# Patient Record
Sex: Female | Born: 1955 | Race: White | Hispanic: No | Marital: Married | Smoking: Former smoker
Health system: Southern US, Community
[De-identification: ages and names within clinical notes are randomized; demographics above are authoritative.]

## PROBLEM LIST (undated history)

## (undated) DIAGNOSIS — G709 Myoneural disorder, unspecified: Secondary | ICD-10-CM

## (undated) DIAGNOSIS — R110 Nausea: Secondary | ICD-10-CM

## (undated) DIAGNOSIS — K8020 Calculus of gallbladder without cholecystitis without obstruction: Secondary | ICD-10-CM

## (undated) DIAGNOSIS — R109 Unspecified abdominal pain: Secondary | ICD-10-CM

## (undated) DIAGNOSIS — I10 Essential (primary) hypertension: Secondary | ICD-10-CM

## (undated) HISTORY — DX: Essential (primary) hypertension: I10

## (undated) HISTORY — DX: Unspecified abdominal pain: R10.9

## (undated) HISTORY — DX: Nausea: R11.0

## (undated) HISTORY — DX: Calculus of gallbladder without cholecystitis without obstruction: K80.20

---

## 1983-07-02 HISTORY — PX: TUBAL LIGATION: SHX77

## 1991-07-02 HISTORY — PX: KNEE SURGERY: SHX244

## 1998-03-04 ENCOUNTER — Encounter: Payer: Self-pay | Admitting: Otolaryngology

## 1998-03-04 ENCOUNTER — Ambulatory Visit (HOSPITAL_COMMUNITY): Admission: RE | Admit: 1998-03-04 | Discharge: 1998-03-04 | Payer: Self-pay | Admitting: Otolaryngology

## 1998-07-01 ENCOUNTER — Emergency Department (HOSPITAL_COMMUNITY): Admission: EM | Admit: 1998-07-01 | Discharge: 1998-07-01 | Payer: Self-pay | Admitting: Emergency Medicine

## 1998-07-01 ENCOUNTER — Encounter: Payer: Self-pay | Admitting: Emergency Medicine

## 1999-07-10 ENCOUNTER — Encounter (INDEPENDENT_AMBULATORY_CARE_PROVIDER_SITE_OTHER): Payer: Self-pay

## 1999-07-10 ENCOUNTER — Ambulatory Visit (HOSPITAL_COMMUNITY): Admission: RE | Admit: 1999-07-10 | Discharge: 1999-07-10 | Payer: Self-pay | Admitting: Gastroenterology

## 2002-06-02 ENCOUNTER — Other Ambulatory Visit: Admission: RE | Admit: 2002-06-02 | Discharge: 2002-06-02 | Payer: Self-pay | Admitting: Obstetrics and Gynecology

## 2003-06-09 ENCOUNTER — Encounter: Admission: RE | Admit: 2003-06-09 | Discharge: 2003-06-09 | Payer: Self-pay | Admitting: Family Medicine

## 2003-12-07 ENCOUNTER — Other Ambulatory Visit: Admission: RE | Admit: 2003-12-07 | Discharge: 2003-12-07 | Payer: Self-pay | Admitting: Obstetrics and Gynecology

## 2004-01-09 ENCOUNTER — Emergency Department (HOSPITAL_COMMUNITY): Admission: EM | Admit: 2004-01-09 | Discharge: 2004-01-09 | Payer: Self-pay | Admitting: Emergency Medicine

## 2006-10-30 ENCOUNTER — Other Ambulatory Visit: Admission: RE | Admit: 2006-10-30 | Discharge: 2006-10-30 | Payer: Self-pay | Admitting: Obstetrics & Gynecology

## 2007-11-03 ENCOUNTER — Other Ambulatory Visit: Admission: RE | Admit: 2007-11-03 | Discharge: 2007-11-03 | Payer: Self-pay | Admitting: Obstetrics & Gynecology

## 2008-11-03 ENCOUNTER — Other Ambulatory Visit: Admission: RE | Admit: 2008-11-03 | Discharge: 2008-11-03 | Payer: Self-pay | Admitting: Obstetrics and Gynecology

## 2009-07-17 ENCOUNTER — Encounter: Admission: RE | Admit: 2009-07-17 | Discharge: 2009-07-17 | Payer: Self-pay | Admitting: Psychiatry

## 2011-11-13 ENCOUNTER — Encounter (INDEPENDENT_AMBULATORY_CARE_PROVIDER_SITE_OTHER): Payer: Self-pay

## 2011-12-16 ENCOUNTER — Telehealth (INDEPENDENT_AMBULATORY_CARE_PROVIDER_SITE_OTHER): Payer: Self-pay | Admitting: General Surgery

## 2011-12-16 ENCOUNTER — Encounter (INDEPENDENT_AMBULATORY_CARE_PROVIDER_SITE_OTHER): Payer: Self-pay | Admitting: General Surgery

## 2011-12-16 ENCOUNTER — Ambulatory Visit (INDEPENDENT_AMBULATORY_CARE_PROVIDER_SITE_OTHER): Payer: BC Managed Care – PPO | Admitting: General Surgery

## 2011-12-16 VITALS — BP 128/80 | HR 70 | Temp 97.6°F | Resp 14 | Ht 66.0 in | Wt 204.5 lb

## 2011-12-16 DIAGNOSIS — K8020 Calculus of gallbladder without cholecystitis without obstruction: Secondary | ICD-10-CM | POA: Insufficient documentation

## 2011-12-16 NOTE — Progress Notes (Signed)
Patient ID: Kristie Nguyen, female   DOB: 04/23/1956, 56 y.o.   MRN: [id number]  Chief Complaint  Patient presents with  . Cholelithiasis    HPI Kristie Nguyen is a 56 y.o. female.   HPI  She is referred by Penny Jones, family nurse practitioner, for evaluation of symptomatic cholelithiasis. She has had intermittent right upper quadrant pain radiating to her back with nausea for many years. Recently, the episodes have become more frequent. She underwent an ultrasound demonstrating cholelithiasis. I do not currently have the ultrasound report but this has been requested. Liver function tests and CBC were within normal limits. No vomiting or fever. No jaundice.  Past Medical History  Diagnosis Date  . Hypertension   . Gallstones   . Nausea   . Abdominal pain     Multiple sclerosis   Depressed mood   Insomnia  Past Surgical History  Procedure Date  . Knee surgery 1993    left  . Tubal ligation 1985    Family History  Problem Relation Age of Onset  . COPD Mother   . Dementia Father     Social History History  Substance Use Topics  . Smoking status: Former Smoker    Quit date: 12/15/2005  . Smokeless tobacco: Never Used  . Alcohol Use: Yes     one beer per month    Allergies  Allergen Reactions  . Sulfa Antibiotics Nausea Only    Current Outpatient Prescriptions  Medication Sig Dispense Refill  . aspirin 81 MG tablet Take 81 mg by mouth daily.      . Calcium Carbonate-Vitamin D (CALCIUM 600 + D PO) Take by mouth 2 (two) times daily.      . cetirizine (ZYRTEC) 10 MG tablet Take 10 mg by mouth daily.      . CRANBERRY-VITAMIN C PO Take 4,200 mg by mouth as needed.      . donepezil (ARICEPT) 10 MG tablet 10 mg as needed.       . estradiol (ESTRACE) 0.5 MG tablet Take 0.5 mg by mouth daily.      . Multiple Vitamin (MULTIVITAMIN) tablet Take 1 tablet by mouth daily.      . natalizumab (TYSABRI) 300 MG/15ML injection Inject into the vein every 30 (thirty) days.       . Nutritional Supplements (MELATONIN PO) Take 10 mg by mouth daily.      . omeprazole (PRILOSEC) 20 MG capsule Take 20 mg by mouth daily.      . oxybutynin (DITROPAN) 5 MG tablet Take 5 mg by mouth as needed.      . Probiotic Product (PROBIOTIC FORMULA PO) Take by mouth as needed.      . CHANTIX STARTING MONTH PAK 0.5 MG X 11 & 1 MG X 42 tablet       . lisinopril-hydrochlorothiazide (PRINZIDE,ZESTORETIC) 10-12.5 MG per tablet 1 tablet daily.       . methylphenidate (RITALIN) 10 MG tablet 10 mg 2 (two) times daily.       . MIMVEY 1-0.5 MG per tablet 1 tablet daily.       . sertraline (ZOLOFT) 100 MG tablet 100 mg daily.       . tiZANidine (ZANAFLEX) 4 MG tablet 4 mg as needed.       . zolpidem (AMBIEN) 10 MG tablet 10 mg at bedtime as needed.         Review of Systems Review of Systems  Constitutional: Negative for fever and chills.  Respiratory:   Negative.   Cardiovascular: Negative.   Gastrointestinal: Positive for nausea and abdominal pain.  Genitourinary: Negative.   Skin: Negative for color change.    Blood pressure 128/80, pulse 70, temperature 97.6 F (36.4 C), temperature source Temporal, resp. rate 14, height 5' 6" (1.676 m), weight 204 lb 8 oz (92.761 kg).  Physical Exam Physical Exam  Constitutional:       Overweight female in no acute distress.  HENT:  Head: Normocephalic and atraumatic.  Eyes: EOM are normal. No scleral icterus.  Cardiovascular: Normal rate and regular rhythm.   Pulmonary/Chest: Effort normal and breath sounds normal.  Abdominal: She exhibits no distension and no mass. There is tenderness (mild in RUQ). There is no guarding.       Small subumbilical scar  Musculoskeletal: She exhibits no edema.  Skin: Skin is warm and dry.    Data Reviewed Notes from primary care physician's office.  Assessment    Symptomatic cholelithiasis. Multiple sclerosis is well-controlled.    Plan    We discussed cholecystectomy, laparoscopic possible open.  She is interested in having this done.  I have explained the procedure, risks, and aftercare of cholecystectomy.  Risks include but are not limited to bleeding, infection, wound problems, anesthesia, diarrhea, bile leak, injury to common bile duct/liver/intestine.  She seems to understand and agrees to proceed.        Nell Gales J 12/16/2011, 10:04 AM    

## 2011-12-16 NOTE — Patient Instructions (Signed)
Strict lowfat diet.  Stop your Aspirin at least 5 days before your surgery.

## 2011-12-17 ENCOUNTER — Encounter (INDEPENDENT_AMBULATORY_CARE_PROVIDER_SITE_OTHER): Payer: Self-pay

## 2011-12-18 ENCOUNTER — Encounter (INDEPENDENT_AMBULATORY_CARE_PROVIDER_SITE_OTHER): Payer: Self-pay

## 2011-12-18 ENCOUNTER — Telehealth (INDEPENDENT_AMBULATORY_CARE_PROVIDER_SITE_OTHER): Payer: Self-pay

## 2011-12-18 NOTE — Telephone Encounter (Signed)
Faxed RTW note to [phone] per pt's request. The pt requested a RTW note to go back to work on 01/06/12 after her lap chole on 12/31/11.

## 2011-12-19 ENCOUNTER — Encounter (HOSPITAL_COMMUNITY): Payer: Self-pay | Admitting: Pharmacy Technician

## 2011-12-20 ENCOUNTER — Encounter (HOSPITAL_COMMUNITY)
Admission: RE | Admit: 2011-12-20 | Discharge: 2011-12-20 | Disposition: A | Discharge status: Home / Self Care | Payer: BC Managed Care – PPO | Source: Ambulatory Visit | Attending: General Surgery | Admitting: General Surgery

## 2011-12-20 ENCOUNTER — Ambulatory Visit (HOSPITAL_COMMUNITY)
Admission: RE | Admit: 2011-12-20 | Discharge: 2011-12-20 | Disposition: A | Discharge status: Home / Self Care | Payer: BC Managed Care – PPO | Source: Ambulatory Visit | Attending: General Surgery | Admitting: General Surgery

## 2011-12-20 ENCOUNTER — Encounter (HOSPITAL_COMMUNITY): Payer: Self-pay

## 2011-12-20 DIAGNOSIS — Z01811 Encounter for preprocedural respiratory examination: Secondary | ICD-10-CM | POA: Insufficient documentation

## 2011-12-20 DIAGNOSIS — Z01810 Encounter for preprocedural cardiovascular examination: Secondary | ICD-10-CM | POA: Insufficient documentation

## 2011-12-20 HISTORY — DX: Myoneural disorder, unspecified: G70.9

## 2011-12-20 LAB — COMPREHENSIVE METABOLIC PANEL
Alkaline Phosphatase: 72 U/L (ref 39–117)
BUN: 17 mg/dL (ref 6–23)
Creatinine, Ser: 0.77 mg/dL (ref 0.50–1.10)
GFR calc Af Amer: >90 mL/min (ref >90)
Glucose, Bld: 82 mg/dL (ref 70–99)
Potassium: 4.3 mEq/L (ref 3.5–5.1)
Total Protein: 7 g/dL (ref 6.0–8.3)

## 2011-12-20 LAB — SURGICAL PCR SCREEN: Staphylococcus aureus: NEGATIVE

## 2011-12-20 LAB — CBC
HCT: 38.2 % (ref 36.0–46.0)
Hemoglobin: 13 g/dL (ref 12.0–15.0)
MCH: 30.4 pg (ref 26.0–34.0)
MCHC: 34 g/dL (ref 30.0–36.0)
MCV: 89.3 fL (ref 78.0–100.0)

## 2011-12-20 LAB — PROTIME-INR: Prothrombin Time: 13.1 seconds (ref 11.6–15.2)

## 2011-12-20 NOTE — Patient Instructions (Signed)
20 Kristie Nguyen  12/20/2011   Your procedure is scheduled on:  12/31/11 [id number]  Report to [facility] at 0700 AM.  Call this number if you have problems the morning of surgery: [phone]   Remember:   Do not eat food:After Midnight.  May have clear liquids:until Midnight .   Take these medicines the morning of surgery with A SIP OF WATER:   Do not wear jewelry, make-up or nail polish.  Do not wear lotions, powders, or perfumes.   Do not shave 48 hours prior to surgery.   Do not bring valuables to the hospital.  Contacts, dentures or bridgework may not be worn into surgery.     Patients discharged the day of surgery will not be allowed to drive home.  Name and phone number of your driver:   Special Instructions: CHG Shower Use Special Wash: 1/2 bottle night before surgery and 1/2 bottle morning of surgery. shower chin to toes with CHG.  Wash face and private parts with regular soap.    Please read over the following fact sheets that you were given: MRSA Information, coughing and deep breathing exericses, leg exercises

## 2011-12-20 NOTE — Pre-Procedure Instructions (Signed)
Teach Back Method used for teaching on preop appointment.   

## 2011-12-25 ENCOUNTER — Inpatient Hospital Stay (HOSPITAL_COMMUNITY): Admission: RE | Admit: 2011-12-25 | Payer: BC Managed Care – PPO | Source: Ambulatory Visit

## 2011-12-31 ENCOUNTER — Encounter (HOSPITAL_COMMUNITY): Payer: Self-pay

## 2011-12-31 ENCOUNTER — Encounter (HOSPITAL_COMMUNITY)
Admission: RE | Disposition: A | Discharge status: Home / Self Care | Payer: Self-pay | Source: Ambulatory Visit | Attending: General Surgery

## 2011-12-31 ENCOUNTER — Ambulatory Visit (HOSPITAL_COMMUNITY): Payer: BC Managed Care – PPO

## 2011-12-31 ENCOUNTER — Ambulatory Visit (HOSPITAL_COMMUNITY): Payer: BC Managed Care – PPO | Admitting: Anesthesiology

## 2011-12-31 ENCOUNTER — Encounter (HOSPITAL_COMMUNITY): Payer: Self-pay | Admitting: Anesthesiology

## 2011-12-31 ENCOUNTER — Ambulatory Visit (HOSPITAL_COMMUNITY)
Admission: RE | Admit: 2011-12-31 | Discharge: 2011-12-31 | Disposition: A | Discharge status: Home / Self Care | Payer: BC Managed Care – PPO | Source: Ambulatory Visit | Attending: General Surgery | Admitting: General Surgery

## 2011-12-31 DIAGNOSIS — I10 Essential (primary) hypertension: Secondary | ICD-10-CM | POA: Insufficient documentation

## 2011-12-31 DIAGNOSIS — Z79899 Other long term (current) drug therapy: Secondary | ICD-10-CM | POA: Insufficient documentation

## 2011-12-31 DIAGNOSIS — K8010 Calculus of gallbladder with chronic cholecystitis without obstruction: Secondary | ICD-10-CM

## 2011-12-31 DIAGNOSIS — Z7982 Long term (current) use of aspirin: Secondary | ICD-10-CM | POA: Insufficient documentation

## 2011-12-31 DIAGNOSIS — K824 Cholesterolosis of gallbladder: Secondary | ICD-10-CM

## 2011-12-31 DIAGNOSIS — G35 Multiple sclerosis: Secondary | ICD-10-CM | POA: Insufficient documentation

## 2011-12-31 HISTORY — PX: CHOLECYSTECTOMY: SHX55

## 2011-12-31 SURGERY — LAPAROSCOPIC CHOLECYSTECTOMY WITH INTRAOPERATIVE CHOLANGIOGRAM
Anesthesia: General | Site: Abdomen | Wound class: Clean Contaminated

## 2011-12-31 MED ORDER — MIDAZOLAM HCL 5 MG/5ML IJ SOLN
INTRAMUSCULAR | Status: DC | PRN
  Administered 2011-12-31: 2 mg via INTRAVENOUS

## 2011-12-31 MED ORDER — BUPIVACAINE HCL 0.5 % IJ SOLN
INTRAMUSCULAR | Status: DC | PRN
  Administered 2011-12-31: 16 mL

## 2011-12-31 MED ORDER — HYDROMORPHONE HCL PF 1 MG/ML IJ SOLN
0.2500 mg | INTRAMUSCULAR | Status: DC | PRN
  Administered 2011-12-31: 0.5 mg via INTRAVENOUS
  Administered 2011-12-31 (×2): 0.25 mg via INTRAVENOUS
  Administered 2011-12-31: 0.5 mg via INTRAVENOUS

## 2011-12-31 MED ORDER — IOHEXOL 300 MG/ML  SOLN
INTRAMUSCULAR | Status: DC | PRN
  Administered 2011-12-31: 2.5 mL

## 2011-12-31 MED ORDER — HYDROMORPHONE HCL PF 1 MG/ML IJ SOLN
INTRAMUSCULAR | Status: AC
  Filled 2011-12-31: qty 1

## 2011-12-31 MED ORDER — ACETAMINOPHEN 10 MG/ML IV SOLN
INTRAVENOUS | Status: AC
  Filled 2011-12-31: qty 100

## 2011-12-31 MED ORDER — EPHEDRINE SULFATE 50 MG/ML IJ SOLN
INTRAMUSCULAR | Status: DC | PRN
  Administered 2011-12-31: 5 mg via INTRAVENOUS

## 2011-12-31 MED ORDER — 0.9 % SODIUM CHLORIDE (POUR BTL) OPTIME
TOPICAL | Status: DC | PRN
  Administered 2011-12-31: 1000 mL

## 2011-12-31 MED ORDER — ONDANSETRON HCL 4 MG/2ML IJ SOLN
INTRAMUSCULAR | Status: DC | PRN
  Administered 2011-12-31: 4 mg via INTRAVENOUS

## 2011-12-31 MED ORDER — ACETAMINOPHEN 10 MG/ML IV SOLN
1000.0000 mg | Freq: Once | INTRAVENOUS | Status: AC
  Administered 2011-12-31: 1000 mg via INTRAVENOUS

## 2011-12-31 MED ORDER — ROCURONIUM BROMIDE 100 MG/10ML IV SOLN
INTRAVENOUS | Status: DC | PRN
  Administered 2011-12-31: 45 mg via INTRAVENOUS

## 2011-12-31 MED ORDER — DEXAMETHASONE SODIUM PHOSPHATE 10 MG/ML IJ SOLN
INTRAMUSCULAR | Status: DC | PRN
  Administered 2011-12-31: 10 mg via INTRAVENOUS

## 2011-12-31 MED ORDER — METOCLOPRAMIDE HCL 5 MG/ML IJ SOLN
INTRAMUSCULAR | Status: DC | PRN
  Administered 2011-12-31: 10 mg via INTRAVENOUS

## 2011-12-31 MED ORDER — PROMETHAZINE HCL 25 MG/ML IJ SOLN: 6.2500 mg | INTRAMUSCULAR | Status: DC | PRN

## 2011-12-31 MED ORDER — FENTANYL CITRATE 0.05 MG/ML IJ SOLN
INTRAMUSCULAR | Status: AC
  Filled 2011-12-31: qty 2

## 2011-12-31 MED ORDER — BUPIVACAINE HCL 0.5 % IJ SOLN
INTRAMUSCULAR | Status: AC
  Filled 2011-12-31: qty 1

## 2011-12-31 MED ORDER — PROPOFOL 10 MG/ML IV EMUL
INTRAVENOUS | Status: DC | PRN
  Administered 2011-12-31: 200 mg via INTRAVENOUS

## 2011-12-31 MED ORDER — GLYCOPYRROLATE 0.2 MG/ML IJ SOLN
INTRAMUSCULAR | Status: DC | PRN
  Administered 2011-12-31: .8 mg via INTRAVENOUS

## 2011-12-31 MED ORDER — FENTANYL CITRATE 0.05 MG/ML IJ SOLN
INTRAMUSCULAR | Status: DC | PRN
  Administered 2011-12-31 (×5): 50 ug via INTRAVENOUS

## 2011-12-31 MED ORDER — OXYCODONE-ACETAMINOPHEN 5-325 MG PO TABS: 1.0000 | ORAL_TABLET | ORAL | Status: AC | PRN

## 2011-12-31 MED ORDER — FENTANYL CITRATE 0.05 MG/ML IJ SOLN
50.0000 ug | INTRAMUSCULAR | Status: DC | PRN
  Administered 2011-12-31: 100 ug via INTRAVENOUS

## 2011-12-31 MED ORDER — IOHEXOL 300 MG/ML  SOLN
INTRAMUSCULAR | Status: AC
  Filled 2011-12-31: qty 1

## 2011-12-31 MED ORDER — LACTATED RINGERS IV SOLN
INTRAVENOUS | Status: DC
  Administered 2011-12-31: 1000 mL via INTRAVENOUS

## 2011-12-31 MED ORDER — NEOSTIGMINE METHYLSULFATE 1 MG/ML IJ SOLN
INTRAMUSCULAR | Status: DC | PRN
  Administered 2011-12-31: 5 mg via INTRAVENOUS

## 2011-12-31 MED ORDER — OXYCODONE HCL 5 MG PO TABS
ORAL_TABLET | ORAL | Status: AC
  Administered 2011-12-31: 5 mg
  Filled 2011-12-31: qty 1

## 2011-12-31 MED ORDER — LIDOCAINE HCL (CARDIAC) 20 MG/ML IV SOLN
INTRAVENOUS | Status: DC | PRN
  Administered 2011-12-31: 100 mg via INTRAVENOUS

## 2011-12-31 MED ORDER — LACTATED RINGERS IR SOLN
Status: DC | PRN
  Administered 2011-12-31: 1000 mL

## 2011-12-31 MED ORDER — CEFAZOLIN SODIUM-DEXTROSE 2-3 GM-% IV SOLR
INTRAVENOUS | Status: AC
  Filled 2011-12-31: qty 50

## 2011-12-31 MED ORDER — CEFAZOLIN SODIUM-DEXTROSE 2-3 GM-% IV SOLR
2.0000 g | INTRAVENOUS | Status: AC
  Administered 2011-12-31: 2 g via INTRAVENOUS

## 2011-12-31 MED ORDER — KETOROLAC TROMETHAMINE 30 MG/ML IJ SOLN: 15.0000 mg | Freq: Once | INTRAMUSCULAR | Status: DC | PRN

## 2011-12-31 SURGICAL SUPPLY — 48 items
APL SKNCLS STERI-STRIP NONHPOA (GAUZE/BANDAGES/DRESSINGS) ×1
APPLIER CLIP 5 13 M/L LIGAMAX5 (MISCELLANEOUS) ×2
APPLIER CLIP ROT 10 11.4 M/L (STAPLE)
APR CLP MED LRG 11.4X10 (STAPLE)
APR CLP MED LRG 5 ANG JAW (MISCELLANEOUS) ×1
BAG SPEC RTRVL LRG 6X4 10 (ENDOMECHANICALS) ×1
BENZOIN TINCTURE PRP APPL 2/3 (GAUZE/BANDAGES/DRESSINGS) ×2 IMPLANT
CANISTER SUCTION 2500CC (MISCELLANEOUS) ×2 IMPLANT
CHLORAPREP W/TINT 26ML (MISCELLANEOUS) ×2 IMPLANT
CLIP APPLIE 5 13 M/L LIGAMAX5 (MISCELLANEOUS) ×1 IMPLANT
CLIP APPLIE ROT 10 11.4 M/L (STAPLE) IMPLANT
CLOTH BEACON ORANGE TIMEOUT ST (SAFETY) ×2 IMPLANT
COVER MAYO STAND STRL (DRAPES) ×1 IMPLANT
COVER SURGICAL LIGHT HANDLE (MISCELLANEOUS) ×1 IMPLANT
DECANTER SPIKE VIAL GLASS SM (MISCELLANEOUS) ×2 IMPLANT
DRAPE C-ARM 42X72 X-RAY (DRAPES) ×1 IMPLANT
DRAPE LAPAROSCOPIC ABDOMINAL (DRAPES) ×2 IMPLANT
DRAPE UTILITY XL STRL (DRAPES) ×2 IMPLANT
DRSG TEGADERM 2-3/8X2-3/4 SM (GAUZE/BANDAGES/DRESSINGS) ×8 IMPLANT
ELECT REM PT RETURN 9FT ADLT (ELECTROSURGICAL) ×2
ELECTRODE REM PT RTRN 9FT ADLT (ELECTROSURGICAL) ×1 IMPLANT
ENDOLOOP SUT PDS II  0 18 (SUTURE)
ENDOLOOP SUT PDS II 0 18 (SUTURE) IMPLANT
GAUZE SPONGE 2X2 8PLY STRL LF (GAUZE/BANDAGES/DRESSINGS) IMPLANT
GLOVE BIOGEL PI IND STRL 7.0 (GLOVE) ×1 IMPLANT
GLOVE BIOGEL PI INDICATOR 7.0 (GLOVE) ×1
GLOVE ECLIPSE 8.0 STRL XLNG CF (GLOVE) ×2 IMPLANT
GLOVE INDICATOR 8.0 STRL GRN (GLOVE) ×4 IMPLANT
GOWN STRL NON-REIN LRG LVL3 (GOWN DISPOSABLE) ×3 IMPLANT
GOWN STRL REIN XL XLG (GOWN DISPOSABLE) ×4 IMPLANT
HEMOSTAT SNOW SURGICEL 2X4 (HEMOSTASIS) ×1 IMPLANT
HEMOSTAT SURGICEL 4X8 (HEMOSTASIS) IMPLANT
IV CATH 14GX2 1/4 (CATHETERS) IMPLANT
KIT BASIN OR (CUSTOM PROCEDURE TRAY) ×2 IMPLANT
NS IRRIG 1000ML POUR BTL (IV SOLUTION) IMPLANT
POUCH SPECIMEN RETRIEVAL 10MM (ENDOMECHANICALS) ×1 IMPLANT
SET CHOLANGIOGRAPH MIX (MISCELLANEOUS) ×2 IMPLANT
SET IRRIG TUBING LAPAROSCOPIC (IRRIGATION / IRRIGATOR) ×2 IMPLANT
SOLUTION ANTI FOG 6CC (MISCELLANEOUS) ×2 IMPLANT
SPONGE GAUZE 2X2 STER 10/PKG (GAUZE/BANDAGES/DRESSINGS) ×1
STRIP CLOSURE SKIN 1/2X4 (GAUZE/BANDAGES/DRESSINGS) ×2 IMPLANT
SUT MNCRL AB 4-0 PS2 18 (SUTURE) ×2 IMPLANT
TOWEL OR 17X26 10 PK STRL BLUE (TOWEL DISPOSABLE) ×2 IMPLANT
TRAY LAP CHOLE (CUSTOM PROCEDURE TRAY) ×2 IMPLANT
TROCAR BLADELESS OPT 5 75 (ENDOMECHANICALS) ×6 IMPLANT
TROCAR XCEL BLUNT TIP 100MML (ENDOMECHANICALS) ×2 IMPLANT
TROCAR XCEL NON-BLD 11X100MML (ENDOMECHANICALS) IMPLANT
TUBING INSUFFLATION 10FT LAP (TUBING) ×2 IMPLANT

## 2011-12-31 NOTE — H&P (View-Only) (Signed)
Patient ID: Kristie Nguyen, female   DOB: January 22, 1956, 56 y.o.   MRN: [id number]  Chief Complaint  Patient presents with  . Cholelithiasis    HPI Kristie Nguyen is a 56 y.o. female.   HPI  She is referred by Zoe Lan, family nurse practitioner, for evaluation of symptomatic cholelithiasis. She has had intermittent right upper quadrant pain radiating to her back with nausea for many years. Recently, the episodes have become more frequent. She underwent an ultrasound demonstrating cholelithiasis. I do not currently have the ultrasound report but this has been requested. Liver function tests and CBC were within normal limits. No vomiting or fever. No jaundice.  Past Medical History  Diagnosis Date  . Hypertension   . Gallstones   . Nausea   . Abdominal pain     Multiple sclerosis   Depressed mood   Insomnia  Past Surgical History  Procedure Date  . Knee surgery 1993    left  . Tubal ligation 1985    Family History  Problem Relation Age of Onset  . COPD Mother   . Dementia Father     Social History History  Substance Use Topics  . Smoking status: Former Smoker    Quit date: 12/15/2005  . Smokeless tobacco: Never Used  . Alcohol Use: Yes     one beer per month    Allergies  Allergen Reactions  . Sulfa Antibiotics Nausea Only    Current Outpatient Prescriptions  Medication Sig Dispense Refill  . aspirin 81 MG tablet Take 81 mg by mouth daily.      . Calcium Carbonate-Vitamin D (CALCIUM 600 + D PO) Take by mouth 2 (two) times daily.      . cetirizine (ZYRTEC) 10 MG tablet Take 10 mg by mouth daily.      Marland Kitchen CRANBERRY-VITAMIN C PO Take 4,200 mg by mouth as needed.      . donepezil (ARICEPT) 10 MG tablet 10 mg as needed.       Marland Kitchen estradiol (ESTRACE) 0.5 MG tablet Take 0.5 mg by mouth daily.      . Multiple Vitamin (MULTIVITAMIN) tablet Take 1 tablet by mouth daily.      . natalizumab (TYSABRI) 300 MG/15ML injection Inject into the vein every 30 (thirty) days.       . Nutritional Supplements (MELATONIN PO) Take 10 mg by mouth daily.      Marland Kitchen omeprazole (PRILOSEC) 20 MG capsule Take 20 mg by mouth daily.      Marland Kitchen oxybutynin (DITROPAN) 5 MG tablet Take 5 mg by mouth as needed.      . Probiotic Product (PROBIOTIC FORMULA PO) Take by mouth as needed.      . CHANTIX STARTING MONTH PAK 0.5 MG X 11 & 1 MG X 42 tablet       . lisinopril-hydrochlorothiazide (PRINZIDE,ZESTORETIC) 10-12.5 MG per tablet 1 tablet daily.       . methylphenidate (RITALIN) 10 MG tablet 10 mg 2 (two) times daily.       Marland Kitchen MIMVEY 1-0.5 MG per tablet 1 tablet daily.       . sertraline (ZOLOFT) 100 MG tablet 100 mg daily.       Marland Kitchen tiZANidine (ZANAFLEX) 4 MG tablet 4 mg as needed.       . zolpidem (AMBIEN) 10 MG tablet 10 mg at bedtime as needed.         Review of Systems Review of Systems  Constitutional: Negative for fever and chills.  Respiratory:  Negative.   Cardiovascular: Negative.   Gastrointestinal: Positive for nausea and abdominal pain.  Genitourinary: Negative.   Skin: Negative for color change.    Blood pressure 128/80, pulse 70, temperature 97.6 F (36.4 C), temperature source Temporal, resp. rate 14, height 5\' 6"  (1.676 m), weight 204 lb 8 oz (92.761 kg).  Physical Exam Physical Exam  Constitutional:       Overweight female in no acute distress.  HENT:  Head: Normocephalic and atraumatic.  Eyes: EOM are normal. No scleral icterus.  Cardiovascular: Normal rate and regular rhythm.   Pulmonary/Chest: Effort normal and breath sounds normal.  Abdominal: She exhibits no distension and no mass. There is tenderness (mild in RUQ). There is no guarding.       Small subumbilical scar  Musculoskeletal: She exhibits no edema.  Skin: Skin is warm and dry.    Data Reviewed Notes from primary care physician's office.  Assessment    Symptomatic cholelithiasis. Multiple sclerosis is well-controlled.    Plan    We discussed cholecystectomy, laparoscopic possible open.  She is interested in having this done.  I have explained the procedure, risks, and aftercare of cholecystectomy.  Risks include but are not limited to bleeding, infection, wound problems, anesthesia, diarrhea, bile leak, injury to common bile duct/liver/intestine.  She seems to understand and agrees to proceed.        Kasidee Voisin J 12/16/2011, 10:04 AM

## 2011-12-31 NOTE — Progress Notes (Signed)
1215 up to BR ambulated without problems, unable to void

## 2011-12-31 NOTE — Progress Notes (Signed)
1135 4 incisions # 3 was skipped when the assessment was started

## 2011-12-31 NOTE — Op Note (Signed)
Preoperative diagnosis:  Symptomatic cholelithiasis  Postoperative diagnosis:  Same  Procedure: Laparoscopic cholecystectomy with cholangiogram.  Surgeon: Avel Peace, M.D.  Asst.:  Darnell Level, M.D.  Anesthesia: General with Marcaine (local)  Indication:   This is a 56 year old female who has pretty typical biliary colic type symptoms. She was discovered to have cholelithiasis. Liver function tests were within normal limits. She now presents for elective cholecystectomy. The procedure, risks, and after care were discussed with her preoperatively.  Technique: She was brought to the operating room, placed supine on the operating table, and a general anesthetic was administered. The hair on the abdominal wall was clipped as was necessary. The abdominal wall was then sterilely prepped and draped. Local anesthetic (Marcaine) was infiltrated in the subumbilical region. A small subumbilical incision was made through the skin, subcutaneous tissue, fascia, and peritoneum entering the peritoneal cavity under direct vision. A pursestring suture of 0 Vicryl was placed around the edges of the fascia. A Hassan trocar was introduced into the peritoneal cavity and a pneumoperitoneum was created by insufflation of carbon dioxide gas. The laparoscope was introduced into the trocar and no underlying bleeding or organ injury was noted. The patient was then placed in the reverse Trendelenburg position with the right side tilted slightly up.  Three more trochars were then placed into the abdominal cavity under laparoscopic vision. One in the epigastric area, and 2 in the right upper quadrant area. The gallbladder was visualized and the fundus was grasped and retracted toward the right shoulder.  There were adhesions between the omentum and the body and infundibulum of the gallbladder which were separated bluntly.  The infundibulum was mobilized with dissection close to the gallbladder and retracted laterally. The  cystic duct was identified and a window was created around it. The cystic artery was also identified and a window was created around it. The critical view was achieved. A clip was placed at the neck of the gallbladder. A small incision was made in the cystic duct. A cholangiocatheter was introduced through the anterior abdominal wall and placed in the cystic duct. A intraoperative cholangiogram was then performed.  Under real-time fluoroscopy, dilute contrast was injected into the cystic duct.  The common hepatic duct, the right and left hepatic ducts, and the common duct were all visualized. Contrast drained into the duodenum without obvious evidence of any obstructing ductal lesion. The final report is pending the Radiologist's interpretation.  The cholangiocatheter was removed, the cystic duct was clipped 3 times on the biliary side, and then the cystic duct was divided sharply. No bile leak was noted from the cystic duct stump.  The cystic artery was then clipped and divided. Following this the gallbladder was dissected free from the liver using electrocautery. The gallbladder was then placed in a retrieval bag and removed from the abdominal cavity through the subumbilical incision.  The gallbladder fossa was inspected, irrigated, and bleeding was controlled with electrocautery. Inspection showed that hemostasis was adequate and there was no evidence of bile leak.  A piece of Surgicel was placed in the gallbladder fossa.  The irrigation fluid was evacuated as much as possible.  The subumbilical trocar was removed and the fascial defect was closed by tightening and tying down the pursestring suture under laparoscopic vision.  The remaining trochars were removed and the pneumoperitoneum was released. The skin incisions were closed with 4-0 Monocryl subcuticular stitches. Steri-Strips and sterile dressings were applied.  The procedure was well-tolerated without any apparent complications. The patient  was  taken to the recovery room in satisfactory condition.

## 2011-12-31 NOTE — Preoperative (Signed)
Beta Blockers   Reason not to administer Beta Blockers:Not Applicable 

## 2011-12-31 NOTE — Anesthesia Postprocedure Evaluation (Signed)
  Anesthesia Post-op Note  Patient: Kristie Nguyen  Procedure(s) Performed: Procedure(s) (LRB): LAPAROSCOPIC CHOLECYSTECTOMY WITH INTRAOPERATIVE CHOLANGIOGRAM (N/A)  Patient Location: PACU  Anesthesia Type: General  Level of Consciousness: awake and alert   Airway and Oxygen Therapy: Patient Spontanous Breathing  Post-op Pain: mild  Post-op Assessment: Post-op Vital signs reviewed, Patient's Cardiovascular Status Stable, Respiratory Function Stable, Patent Airway and No signs of Nausea or vomiting  Post-op Vital Signs: stable  Complications: No apparent anesthesia complications

## 2011-12-31 NOTE — Progress Notes (Signed)
1220 Unable to void

## 2011-12-31 NOTE — Discharge Instructions (Signed)
[facility] ______[facility] LAPAROSCOPIC SURGERY: POST OP INSTRUCTIONS Always review your discharge instruction sheet given to you by the facility where your surgery was performed. IF YOU HAVE DISABILITY OR FAMILY LEAVE FORMS, YOU MUST BRING THEM TO THE OFFICE FOR PROCESSING.   DO NOT GIVE THEM TO YOUR DOCTOR.  1. A prescription for pain medication may be given to you upon discharge.  Take your pain medication as prescribed, if needed.  If narcotic pain medicine is not needed, then you may take acetaminophen (Tylenol) or ibuprofen (Advil) as needed. 2. Take your usually prescribed medications unless otherwise directed. 3. If you need a refill on your pain medication, please contact your pharmacy.  They will contact our office to request authorization. Prescriptions will not be filled after 5pm or on week-ends. 4. You should follow a light diet the first few days after arrival home, such as soup and crackers, etc.  Be sure to include lots of fluids daily. 5. Most patients will experience some swelling and bruising in the area of the incisions.  Ice packs will help.  Swelling and bruising can take several days to resolve.  6. It is common to experience some constipation if taking pain medication after surgery.  Increasing fluid intake and taking a stool softener (such as Colace) will usually help or prevent this problem from occurring.  A mild laxative (Milk of Magnesia or Miralax) should be taken according to package instructions if there are no bowel movements after 48 hours. 7. Unless discharge instructions indicate otherwise, you may remove your bandages 72 hours after surgery, and you may shower at that time.  You may have steri-strips (small skin tapes) in place directly over the incision.  These strips should be left on the skin for 14 days.  If your surgeon used skin glue on the incision, you may shower in 24 hours.  The glue will flake off over the next 2-3 weeks.  Any sutures or  staples will be removed at the office during your follow-up visit. 8. ACTIVITIES:  You may resume regular (light) daily activities beginning the next day--such as daily self-care, walking, climbing stairs--gradually increasing activities as tolerated.  You may have sexual intercourse when it is comfortable.  Refrain from any heavy lifting or straining until approved by your doctor-nothing over 10 pounds for 2 weeks. a. You may drive when you are no longer taking prescription pain medication, you can comfortably wear a seatbelt, and you can safely maneuver your car and apply brakes. b. 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 You should see your doctor in the office for a follow-up appointment approximately 2-3 weeks after your surgery.  Make sure that you call for this appointment within a day or two after you arrive home to insure a convenient appointment time. 10. OTHER INSTRUCTIONS: __________________________________________________________________________________________________________________________ __________________________________________________________________________________________________________________________ WHEN TO CALL YOUR DOCTOR: 1. Fever over 101.5 2. Inability to urinate 3. Continued bleeding from incision. 4. Increased pain, redness, or drainage from the incision. 5. Increasing abdominal pain  The clinic staff is available to answer your questions during regular business hours.  Please don't hesitate to call and ask to speak to one of the nurses for clinical concerns.  If you have a medical emergency, go to the nearest emergency room or call 911.  A surgeon from [facility] is always on call at the hospital. [address], [address], [place], [place]  [address] ? [address]  [place] [place], [place]   [address] [phone] ? [phone] ? FAX [phone] Web site:  [email or web address]

## 2011-12-31 NOTE — Anesthesia Preprocedure Evaluation (Addendum)
Anesthesia Evaluation  Patient identified by MRN, date of birth, ID band Patient awake    Reviewed: Allergy & Precautions, H&P , NPO status , Patient's Chart, lab work & pertinent test results  History of Anesthesia Complications (+) AWARENESS UNDER ANESTHESIA  Airway Mallampati: II TM Distance: <3 FB Neck ROM: Full    Dental No notable dental hx.    Pulmonary former smoker breath sounds clear to auscultation  Pulmonary exam normal - decreased breath sounds      Cardiovascular hypertension, Pt. on medications Rhythm:Regular Rate:Normal     Neuro/Psych negative neurological ROS  negative psych ROS   GI/Hepatic Neg liver ROS, GERD-  Medicated,  Endo/Other  Morbid obesity  Renal/GU negative Renal ROS  negative genitourinary   Musculoskeletal negative musculoskeletal ROS (+)   Abdominal   Peds negative pediatric ROS (+)  Hematology negative hematology ROS (+)   Anesthesia Other Findings   Reproductive/Obstetrics negative OB ROS                          Anesthesia Physical Anesthesia Plan  ASA: II  Anesthesia Plan: General   Post-op Pain Management:    Induction: Intravenous  Airway Management Planned: Oral ETT  Additional Equipment:   Intra-op Plan:   Post-operative Plan: Extubation in OR  Informed Consent: I have reviewed the patients History and Physical, chart, labs and discussed the procedure including the risks, benefits and alternatives for the proposed anesthesia with the patient or authorized representative who has indicated his/her understanding and acceptance.   Dental advisory given  Plan Discussed with: CRNA  Anesthesia Plan Comments:         Anesthesia Quick Evaluation

## 2011-12-31 NOTE — Interval H&P Note (Signed)
History and Physical Interval Note:  12/31/2011 9:11 AM  Kristie Nguyen  has presented today for surgery, with the diagnosis of symptomatic cholelithiasis  The various methods of treatment have been discussed with the patient and family. After consideration of risks, benefits and other options for treatment, the patient has consented to  Procedure(s) (LRB): LAPAROSCOPIC CHOLECYSTECTOMY WITH INTRAOPERATIVE CHOLANGIOGRAM (N/A) as a surgical intervention .  The patient's history has been reviewed, patient examined, no change in status, stable for surgery.  I have reviewed the patients' chart and labs.  Questions were answered to the patient's satisfaction.     Tae Robak [facility] [place]

## 2011-12-31 NOTE — Transfer of Care (Signed)
Immediate Anesthesia Transfer of Care Note  Patient: Kristie Nguyen  Procedure(s) Performed: Procedure(s) (LRB): LAPAROSCOPIC CHOLECYSTECTOMY WITH INTRAOPERATIVE CHOLANGIOGRAM (N/A)  Patient Location: PACU  Anesthesia Type: General  Level of Consciousness: awake, alert , oriented and patient cooperative  Airway & Oxygen Therapy: Patient Spontanous Breathing and Patient connected to face mask oxygen  Post-op Assessment: Report given to PACU RN, Post -op Vital signs reviewed and stable and Patient moving all extremities  Post vital signs: Reviewed and stable  Complications: No apparent anesthesia complications

## 2012-01-01 ENCOUNTER — Encounter (HOSPITAL_COMMUNITY): Payer: Self-pay | Admitting: General Surgery

## 2012-01-08 ENCOUNTER — Encounter (INDEPENDENT_AMBULATORY_CARE_PROVIDER_SITE_OTHER): Payer: Self-pay

## 2012-01-20 ENCOUNTER — Encounter (INDEPENDENT_AMBULATORY_CARE_PROVIDER_SITE_OTHER): Payer: Self-pay | Admitting: General Surgery

## 2012-01-20 ENCOUNTER — Ambulatory Visit (INDEPENDENT_AMBULATORY_CARE_PROVIDER_SITE_OTHER): Payer: BC Managed Care – PPO | Admitting: General Surgery

## 2012-01-20 VITALS — BP 120/78 | HR 80 | Temp 97.3°F | Resp 16 | Ht 66.0 in | Wt 205.0 lb

## 2012-01-20 DIAGNOSIS — Z9889 Other specified postprocedural states: Secondary | ICD-10-CM

## 2012-01-20 NOTE — Patient Instructions (Signed)
Low fat diet recommended.

## 2012-01-20 NOTE — Progress Notes (Signed)
She is here for a postop visit following laparoscopic cholecystectomy.  Diet is being tolerated, bowels are moving.  No problems with incisions.  PE:  ABD:  Soft, incisions are a little hyperemic but not infected.  Assessment:  Doing well postop.  Plan:  Lowfat diet recommended.  Activities as tolerated.  Return visit prn.

## 2012-04-22 LAB — HM PAP SMEAR

## 2012-11-18 ENCOUNTER — Other Ambulatory Visit: Payer: Self-pay | Admitting: Specialist

## 2012-11-18 DIAGNOSIS — M8580 Other specified disorders of bone density and structure, unspecified site: Secondary | ICD-10-CM

## 2012-11-26 ENCOUNTER — Other Ambulatory Visit: Payer: BC Managed Care – PPO

## 2012-12-01 ENCOUNTER — Ambulatory Visit
Admission: RE | Admit: 2012-12-01 | Discharge: 2012-12-01 | Disposition: A | Discharge status: Home / Self Care | Payer: BC Managed Care – PPO | Source: Ambulatory Visit | Attending: Specialist | Admitting: Specialist

## 2012-12-01 DIAGNOSIS — M8580 Other specified disorders of bone density and structure, unspecified site: Secondary | ICD-10-CM

## 2012-12-02 ENCOUNTER — Other Ambulatory Visit: Payer: BC Managed Care – PPO

## 2013-02-09 ENCOUNTER — Other Ambulatory Visit: Payer: Self-pay | Admitting: Psychiatry

## 2013-02-09 DIAGNOSIS — G35 Multiple sclerosis: Secondary | ICD-10-CM

## 2013-02-15 ENCOUNTER — Other Ambulatory Visit: Payer: BC Managed Care – PPO

## 2013-02-17 ENCOUNTER — Ambulatory Visit
Admission: RE | Admit: 2013-02-17 | Discharge: 2013-02-17 | Disposition: A | Discharge status: Home / Self Care | Payer: BC Managed Care – PPO | Source: Ambulatory Visit | Attending: Psychiatry | Admitting: Psychiatry

## 2013-02-17 DIAGNOSIS — G35 Multiple sclerosis: Secondary | ICD-10-CM

## 2013-02-17 MED ORDER — GADOBENATE DIMEGLUMINE 529 MG/ML IV SOLN
19.0000 mL | Freq: Once | INTRAVENOUS | Status: AC | PRN
  Administered 2013-02-17: 19 mL via INTRAVENOUS

## 2013-03-22 ENCOUNTER — Ambulatory Visit (INDEPENDENT_AMBULATORY_CARE_PROVIDER_SITE_OTHER): Payer: BC Managed Care – PPO | Admitting: Nurse Practitioner

## 2013-03-22 ENCOUNTER — Encounter: Payer: Self-pay | Admitting: Nurse Practitioner

## 2013-03-22 VITALS — BP 120/76 | HR 64 | Ht 65.25 in | Wt 210.0 lb

## 2013-03-22 DIAGNOSIS — N951 Menopausal and female climacteric states: Secondary | ICD-10-CM

## 2013-03-22 MED ORDER — ESTRADIOL-NORETHINDRONE ACET 1-0.5 MG PO TABS: 1.0000 | ORAL_TABLET | Freq: Every day | ORAL | Status: DC

## 2013-03-22 NOTE — Progress Notes (Signed)
Subjective:     Patient ID: Kristie Nguyen, female   DOB: 1955-11-24, 57 y.o.   MRN: [id number]  HPI  57 yo G2P2 MW Fe presents to review how her HRT is doing.  She has recently changed to [facility] which is a generic for Activella.  She reports no vaso symptoms including night sweats.  Her sleep patterns and mood has improved.  No headaches and no vaginal dryness. Denies vaginal bleeding.  She has had no signs of atypical chest pains or  palpitations. No signs of DVT, and not smoking now.  She did have a flare of MS this past May.  Better now and on a new med with good symptoms relief.  She is now on short term disability.  In general feels well.  February 10, 2013  AEX was done at PCP. Mammogram 02/09/13 BMD 12/01/12 - normal  Review of Systems  Constitutional: Positive for fatigue. Negative for fever, chills, diaphoresis, appetite change and unexpected weight change.  HENT: Negative.   Respiratory: Negative.   Cardiovascular: Negative.   Gastrointestinal: Negative.   Genitourinary: Negative.  Negative for dysuria, urgency, frequency, hematuria, vaginal bleeding, vaginal discharge, menstrual problem, pelvic pain and dyspareunia.  Musculoskeletal: Positive for myalgias, back pain, joint swelling, arthralgias and gait problem.       Secondary to MS flare  Skin: Negative.   Neurological: Negative.   Psychiatric/Behavioral: Negative.        Objective:   Physical Exam  Constitutional: She is oriented to person, place, and time. She appears well-developed and well-nourished.  No exam indicated for today.  Neurological: She is alert and oriented to person, place, and time.  Psychiatric: She has a normal mood and affect. Her behavior is normal. Judgment and thought content normal.       Assessment:     Postmenopausal on HRT Symptom relief with this HRT No PMB    Plan:     Refill Mimvey for another year and plan AEX with pap here next fall.

## 2013-03-23 ENCOUNTER — Encounter: Payer: Self-pay | Admitting: Nurse Practitioner

## 2013-03-23 NOTE — Progress Notes (Signed)
Encounter reviewed by Dr. Brook Silva.  

## 2013-05-06 ENCOUNTER — Other Ambulatory Visit: Payer: Self-pay

## 2013-05-31 ENCOUNTER — Encounter: Payer: Self-pay | Admitting: Obstetrics and Gynecology

## 2013-11-02 IMAGING — CR DG CHEST 2V
2 series · 2 of 2 positions shown · non-contrast
Comparison: None.

CLINICAL DATA: Preoperative respiratory films.

CHEST - 2 VIEW

[w chest pa]
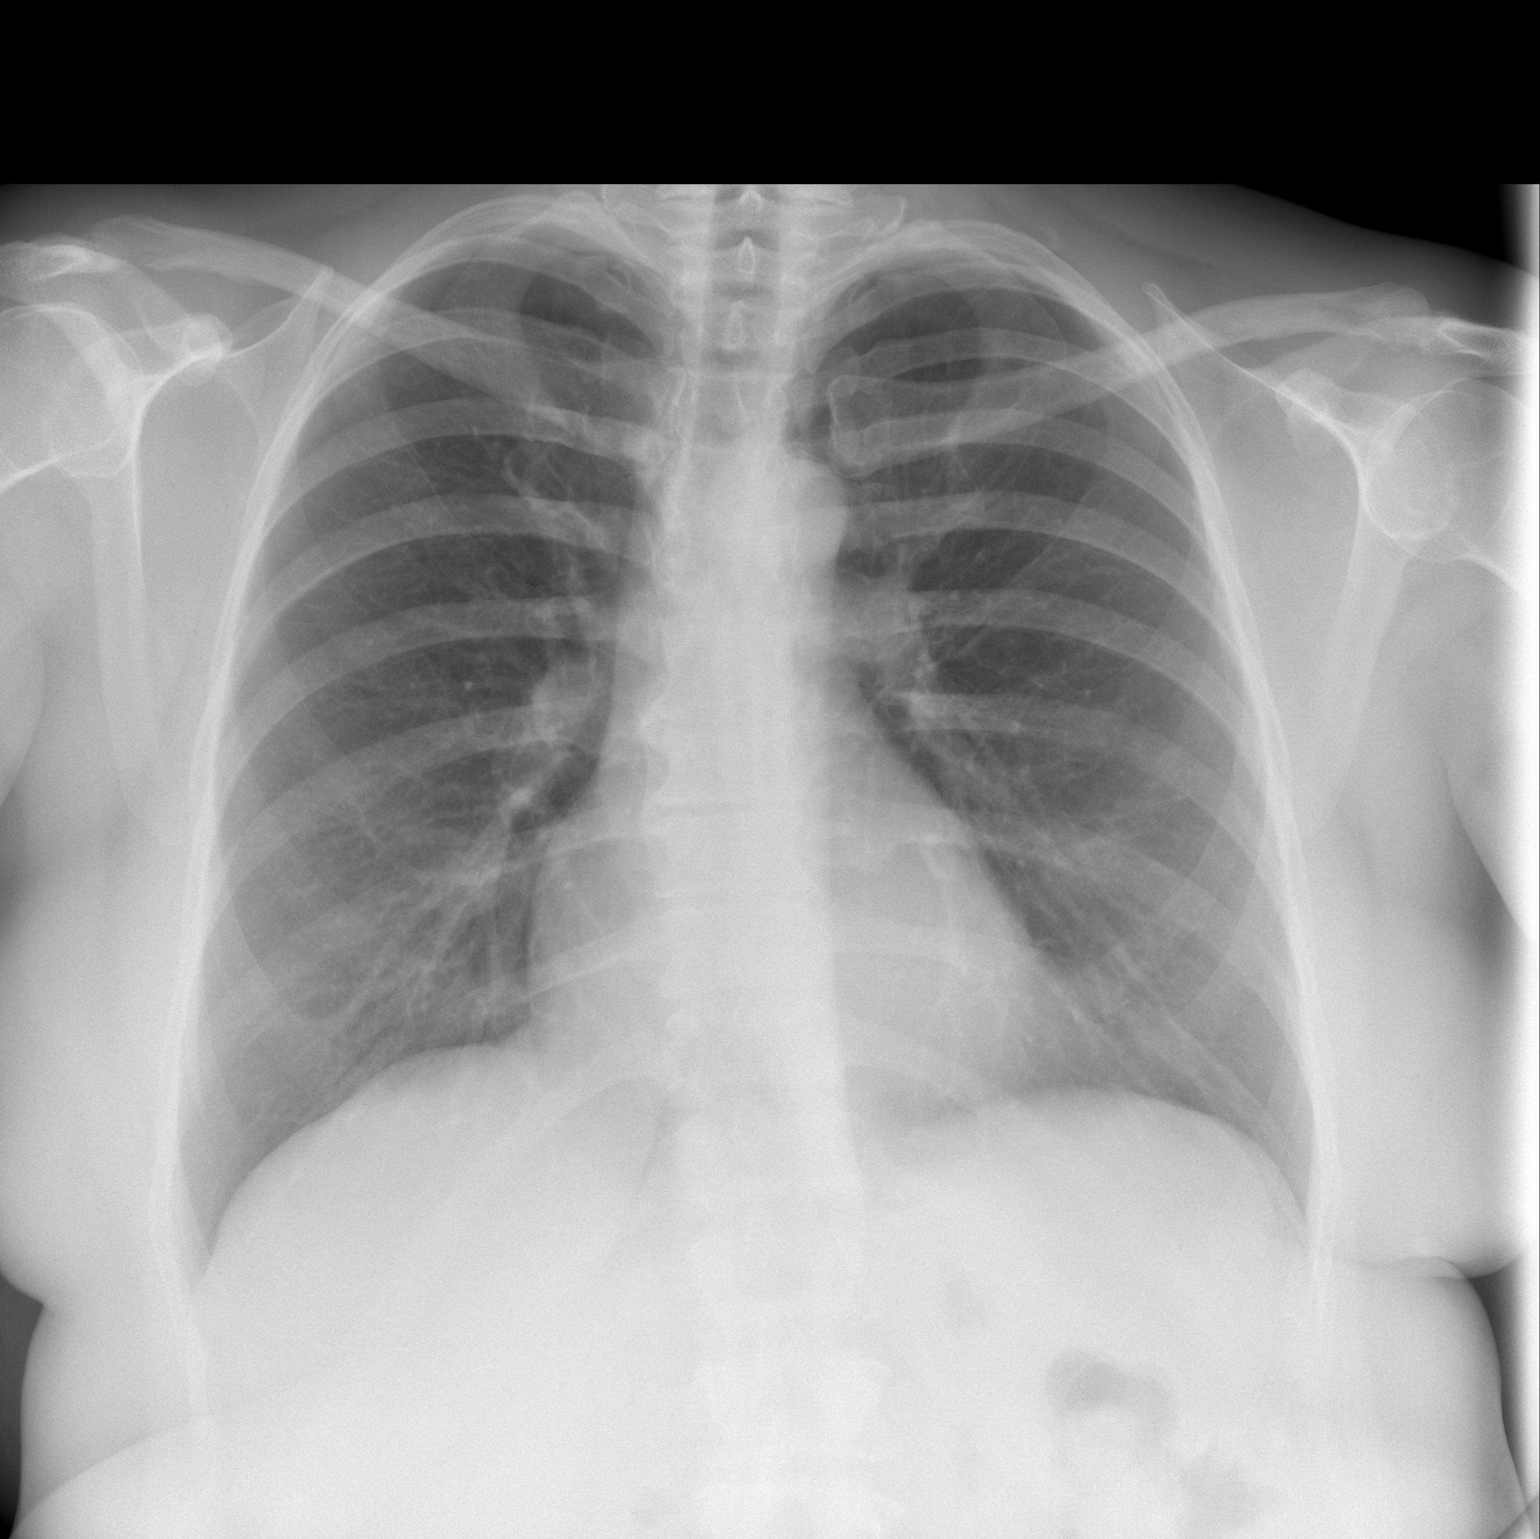

[w chest lat]
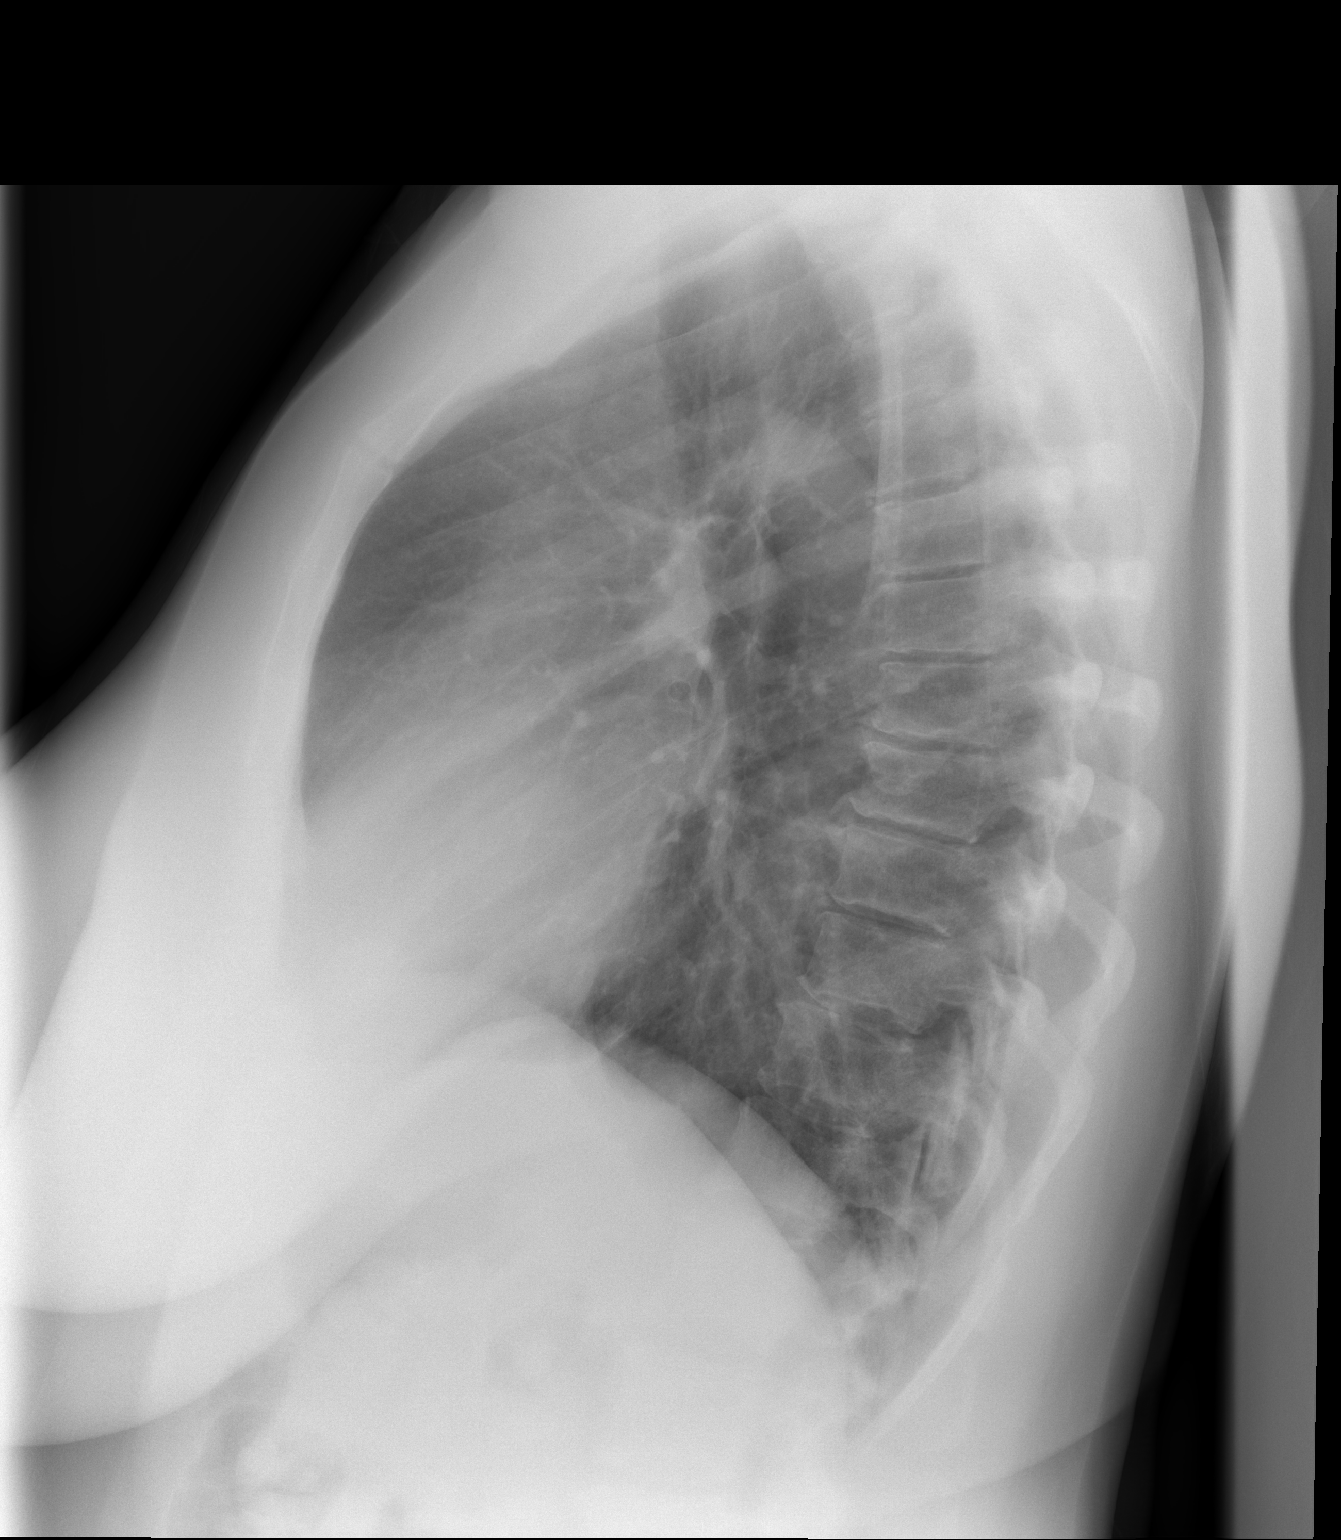

[2 of 2 positions shown; findings below may reference images not displayed]

FINDINGS: The lungs are clear.  Heart size is normal.  No
pneumothorax or pleural fluid.  No focal bony abnormality.
IMPRESSION: Negative chest.

## 2013-11-17 ENCOUNTER — Telehealth: Payer: Self-pay | Admitting: *Deleted

## 2013-11-17 NOTE — Telephone Encounter (Signed)
Letter received from [facility] stating patient has not had 6 month follow up mammogram.  I spoke with Bonita QuinLinda at [facility] who states pt no showed appt in 08/2013 due to not having insurance.  Pt is scheduled for screening on 02/04/14.  I called patient regarding need for follow up mammogram.  She states she did not keep 08/10/13 appt due to not having insurance, and we can see on the 02/09/13 report that "they just saw varicosities."    Pt does not feel follow up is necessary.  Pt also states she has moved to [place] and will be transferring care "at some point".

## 2013-11-26 NOTE — Telephone Encounter (Signed)
Ok to remove from recalls?

## 2013-12-27 ENCOUNTER — Encounter: Payer: Self-pay | Admitting: Obstetrics & Gynecology

## 2013-12-27 NOTE — Telephone Encounter (Signed)
Letter written.  Please print and I can sign tomorrow.  Will then send and out of recall.

## 2013-12-28 NOTE — Telephone Encounter (Signed)
Printed letter to your desk. Recall removed.

## 2014-02-22 ENCOUNTER — Ambulatory Visit: Payer: BC Managed Care – PPO | Admitting: Nurse Practitioner

## 2014-04-15 ENCOUNTER — Other Ambulatory Visit: Payer: Self-pay

## 2014-05-02 ENCOUNTER — Encounter: Payer: Self-pay | Admitting: Nurse Practitioner

## 2014-10-06 DIAGNOSIS — G709 Myoneural disorder, unspecified: Secondary | ICD-10-CM | POA: Insufficient documentation

## 2014-10-06 DIAGNOSIS — I10 Essential (primary) hypertension: Secondary | ICD-10-CM | POA: Insufficient documentation

## 2014-10-06 DIAGNOSIS — K8020 Calculus of gallbladder without cholecystitis without obstruction: Secondary | ICD-10-CM | POA: Insufficient documentation

## 2014-10-07 ENCOUNTER — Encounter: Payer: Self-pay | Admitting: Cardiology

## 2014-10-07 ENCOUNTER — Ambulatory Visit (INDEPENDENT_AMBULATORY_CARE_PROVIDER_SITE_OTHER): Payer: Self-pay | Admitting: Cardiology

## 2014-10-07 VITALS — BP 120/72 | HR 78 | Ht 65.25 in | Wt 203.6 lb

## 2014-10-07 DIAGNOSIS — G709 Myoneural disorder, unspecified: Secondary | ICD-10-CM

## 2014-10-07 DIAGNOSIS — I10 Essential (primary) hypertension: Secondary | ICD-10-CM

## 2014-10-07 DIAGNOSIS — R0789 Other chest pain: Secondary | ICD-10-CM | POA: Insufficient documentation

## 2014-10-07 NOTE — Progress Notes (Signed)
Cardiology Office Note   Date:  10/07/2014   ID:  Kristie Nguyen, DOB 1956/02/26, MRN [id number]  PCP:  Sula Soda  Cardiologist:  Willa Rough, MD   Chief Complaint  Patient presents with  . Appointment    Chest discomfort      History of Present Illness: Kristie Nguyen is a 59 y.o. female who presents today for reevaluation of chest discomfort. The patient is very active.. She does have significant multiple sclerosis. In the past several weeks she had an episode of sharp chest discomfort. This occurred at rest. She had a recurrent episode again several weeks later. She has not had any exertional symptoms. There has been no significant return of symptoms. She does not smoke. There is no diabetes. There is no strong family history of coronary disease. She is overweight. However she is losing weight with a good dietary plan over the past several months.    Past Medical History  Diagnosis Date  . Hypertension   . Gallstones   . Nausea   . Abdominal pain   . Neuromuscular disorder     MS     Past Surgical History  Procedure Laterality Date  . Knee surgery  1993    left  . Cholecystectomy  12/31/2011    Procedure: LAPAROSCOPIC CHOLECYSTECTOMY WITH INTRAOPERATIVE CHOLANGIOGRAM;  Surgeon: Adolph Pollack, MD;  Location: [facility];  Service: General;  Laterality: N/A;  Laparoscopic Cholecystectomy with IOC  . Tubal ligation Bilateral 1985    Patient Active Problem List   Diagnosis Date Noted  . Chest discomfort 10/07/2014  . Hypertension   . Gallstones   . Neuromuscular disorder       Current Outpatient Prescriptions  Medication Sig Dispense Refill  . Calcium Carbonate-Vitamin D (CALCIUM 600 + D PO) Take by mouth 2 (two) times daily.    . cetirizine (ZYRTEC) 10 MG tablet Take 10 mg by mouth daily.    Rhea Bleacher CONTINUING MONTH PAK 1 MG tablet as directed.    . diazepam (VALIUM) 2 MG tablet Take 2 mg by mouth as needed.    . donepezil (ARICEPT) 10 MG  tablet 10 mg daily.     . Estradiol-Norethindrone Acet 0.5-0.1 MG per tablet Take 1 tablet by mouth 4 (four) times a week.    . gabapentin (NEURONTIN) 300 MG capsule Take 1 capsule by mouth daily.    Marland Kitchen GILENYA 0.5 MG CAPS Take 1 tablet by mouth daily.    Marland Kitchen lisinopril-hydrochlorothiazide (PRINZIDE,ZESTORETIC) 10-12.5 MG per tablet 1 tablet daily.     . methylphenidate (RITALIN) 10 MG tablet 10 mg 3 (three) times daily.     . Nutritional Supplements (MELATONIN PO) Take 10 mg by mouth daily.    Marland Kitchen omeprazole (PRILOSEC) 20 MG capsule Take 20 mg by mouth every other day.     . oxybutynin (DITROPAN) 5 MG tablet Take 5 mg by mouth daily. URINARY FREQUENCY    . pregabalin (LYRICA) 75 MG capsule Take 75 mg by mouth daily.    . Probiotic Product (PROBIOTIC FORMULA PO) Take by mouth daily.     . promethazine (PHENERGAN) 25 MG tablet Take 1 tablet by mouth as needed.    . sertraline (ZOLOFT) 100 MG tablet 100 mg daily.     Marland Kitchen tiZANidine (ZANAFLEX) 4 MG tablet 4 mg at bedtime. MS    . zolpidem (AMBIEN) 10 MG tablet 10 mg. SLEEP     No current facility-administered medications for this visit.  Allergies:   Asa and Sulfa antibiotics    Social History:  The patient  reports that she quit smoking about 8 years ago. She has never used smokeless tobacco. She reports that she drinks alcohol. She reports that she does not use illicit drugs.   Family History:  The patient's family history includes COPD in her mother; Dementia in her father.    ROS:  Please see the history of present illness.     Patient denies fever, chills, headache, sweats, rash, change in vision, change in hearing, cough, nausea or vomiting, urinary symptoms. All other systems are reviewed and are negative.    PHYSICAL EXAM: VS:  BP 120/72 mmHg  Pulse 78  Ht 5' 5.25" (1.657 m)  Wt 203 lb 9.6 oz (92.352 kg)  BMI 33.64 kg/m2 , Patient is overweight. She is oriented to person time and place. Affect is normal. Head is atraumatic. Sclera  and conjunctiva are normal. There is no jugular venous distention. Lungs are clear. Respiratory effort is nonlabored. Cardiac exam reveals S1 and S2. The abdomen is soft. There is no peripheral edema. There are no musculoskeletal deformities. There are no skin rashes. Neurologic is grossly intact.  EKG:   EKG is done today and reviewed by me. There is normal sinus rhythm. There are no significant abnormalities.   Recent Labs: No results found for requested labs within last 365 days.    Lipid Panel No results found for: CHOL, TRIG, HDL, CHOLHDL, VLDL, LDLCALC, LDLDIRECT    Wt Readings from Last 3 Encounters:  10/07/14 203 lb 9.6 oz (92.352 kg)  03/22/13 210 lb (95.255 kg)  01/20/12 205 lb (92.987 kg)      Current medicines are reviewed  The patient understands her medications.     ASSESSMENT AND PLAN:

## 2014-10-07 NOTE — Assessment & Plan Note (Signed)
Patient has significant multiple sclerosis. It is managed aggressively by neurology.

## 2014-10-07 NOTE — Assessment & Plan Note (Signed)
Blood pressures controlled. No change in therapy. 

## 2014-10-07 NOTE — Patient Instructions (Signed)
Your physician recommends that you continue on your current medications as directed. Please refer to the Current Medication list given to you today.   Your physician recommends that you schedule a follow-up appointment as needed  

## 2014-10-07 NOTE — Assessment & Plan Note (Signed)
The patient had several episodes of nonspecific sharp chest pain. At this point there is no evidence of cardiac disease. Her EKG shows no significant abnormalities. Historically her lipids have not been elevated. She will send copies to me when she has them. I feel that she does not need any further cardiac workup.

## 2021-09-18 ENCOUNTER — Ambulatory Visit: Payer: Medicare HMO | Admitting: Psychology

## 2021-09-18 DIAGNOSIS — F4323 Adjustment disorder with mixed anxiety and depressed mood: Secondary | ICD-10-CM | POA: Diagnosis not present
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
## 2021-09-25 ENCOUNTER — Ambulatory Visit: Payer: Medicare HMO | Admitting: Psychology

## 2021-09-25 DIAGNOSIS — F4323 Adjustment disorder with mixed anxiety and depressed mood: Secondary | ICD-10-CM

## 2021-09-25 NOTE — Progress Notes (Signed)
[facility] Counselor/Therapist Progress Note ? ?Patient ID: Kristie Nguyen, MRN: [id number],   ? ?Date: 09/25/2021 ? ?Time Spent: 45 minutes ? ?Treatment Type: Individual Therapy ? ?Reported Symptoms: sadness, worry ? ?Mental Status Exam: ?Appearance:  Casual     ?Behavior: Appropriate  ?Motor: Normal  ?Speech/Language:  Normal Rate  ?Affect: Blunt  ?Mood: normal  ?Thought process: normal  ?Thought content:   WNL  ?Sensory/Perceptual disturbances:   WNL  ?Orientation: oriented to person, place, time/date, and situation  ?Attention: Good  ?Concentration:  Good  ?Memory: WNL  ?Fund of knowledge:  Good  ?Insight:   Good  ?Judgment:  Good  ?Impulse Control: Good  ? ?Risk Assessment: ?Danger to Self:  No ?Self-injurious Behavior: No ?Danger to Others: No ?Duty to Warn:no ?Physical Aggression / Violence:No  ?Access to Firearms a concern: No  ?Gang Involvement:No  ? ?Subjective: The patient attended an individual therapy session via video visit today.  The patient gave verbal consent for the session to be on web ex.  Patient was in her home alone and therapist was in the office.  The patient reports that she has been doing okay this week.  She is struggling a little bit with missing her granddaughter.  The patient talked more about the interactions that led them to the argument where they had to leave the house that with her daughter and her family.  The patient seems to be managing things well at this point.  We talked about what could have happened with her daughter and that likely things have gone so far as to not be able to turn around and go back.  We discussed coping strategies and what she does to take care of herself. ? ?Interventions: Cognitive Behavioral Therapy and Insight-Oriented ? ?Diagnosis:Adjustment disorder with mixed anxiety and depressed mood ? ?Plan: Please see plan in therapy charts with target date of 09/26/2022.  The patient has approved this treatment plan.   ? ?Kursten Kruk G Damonica Chopra,  LCSW ? ? ? ? ? ? ? ? ? ? ? ? ? ? ? ? ? ?Sanai Frick G Alesa Echevarria, LCSW ?

## 2021-10-05 ENCOUNTER — Ambulatory Visit (INDEPENDENT_AMBULATORY_CARE_PROVIDER_SITE_OTHER): Payer: Medicare HMO | Admitting: Psychology

## 2021-10-05 DIAGNOSIS — F4323 Adjustment disorder with mixed anxiety and depressed mood: Secondary | ICD-10-CM

## 2021-10-05 DIAGNOSIS — F3181 Bipolar II disorder: Secondary | ICD-10-CM
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
## 2021-10-09 ENCOUNTER — Ambulatory Visit (INDEPENDENT_AMBULATORY_CARE_PROVIDER_SITE_OTHER): Payer: Medicare HMO | Admitting: Psychology

## 2021-10-09 DIAGNOSIS — F3181 Bipolar II disorder: Secondary | ICD-10-CM

## 2021-10-09 DIAGNOSIS — F4323 Adjustment disorder with mixed anxiety and depressed mood: Secondary | ICD-10-CM | POA: Diagnosis not present

## 2021-10-10 NOTE — Progress Notes (Signed)
[facility] Counselor/Therapist Progress Note ? ?Patient ID: Kristie Nguyen, MRN: [id number],   ? ?Date: 10/09/2021 ? ?Time Spent: 60 minutes ? ?Treatment Type: Individual Therapy ? ?Reported Symptoms: sadness, worry ? ?Mental Status Exam: ?Appearance:  Casual     ?Behavior: Appropriate  ?Motor: Normal  ?Speech/Language:  Normal Rate  ?Affect: Blunt  ?Mood: normal  ?Thought process: normal  ?Thought content:   WNL  ?Sensory/Perceptual disturbances:   WNL  ?Orientation: oriented to person, place, time/date, and situation  ?Attention: Good  ?Concentration:  Good  ?Memory: WNL  ?Fund of knowledge:  Good  ?Insight:   Good  ?Judgment:  Good  ?Impulse Control: Good  ? ?Risk Assessment: ?Danger to Self:  No ?Self-injurious Behavior: No ?Danger to Others: No ?Duty to Warn:no ?Physical Aggression / Violence:No  ?Access to Firearms a concern: No  ?Gang Involvement:No  ? ?Subjective: The patient attended an individual therapy session via video visit today.  The patient gave verbal consent for the session to be on web ex.  Patient was in her home alone and therapist was in the office.  The patient presents as pleasant and cooperative.  The patient seems to be doing better than she was the last time that I saw her.  The patient reports that she feels like she is doing a good job with doing nothing about her daughter's situation.  She reports that she feels like I gave her permission to let that situation go and that she did not need to stay engaged in the drama.  We continued to talk about ways for her to take care of herself and maintain the distance that she needs to maintain.   ? ?Interventions: Cognitive Behavioral Therapy and Insight-Oriented ? ?Diagnosis:Adjustment disorder with mixed anxiety and depressed mood ? ?Bipolar II disorder (HCC) ? ?Plan: Please see plan in therapy charts with target date of 09/26/2022.  The patient has approved this treatment plan.   ? ?Jerrika Ledlow G Tristen Luce,  LCSW ? ? ? ? ? ? ? ? ? ? ? ? ? ? ? ? ? ?Meili Kleckley G Nai Dasch, LCSW ? ? ? ? ? ? ? ? ? ? ? ? ? ? ?Rosmarie Esquibel G Azarel Banner, LCSW ? ? ? ? ? ? ? ? ? ? ? ? ? ? ?Iliyana Convey G Garrit Marrow, LCSW ?

## 2021-10-16 ENCOUNTER — Ambulatory Visit (INDEPENDENT_AMBULATORY_CARE_PROVIDER_SITE_OTHER): Payer: Medicare HMO | Admitting: Psychology

## 2021-10-16 DIAGNOSIS — F4323 Adjustment disorder with mixed anxiety and depressed mood: Secondary | ICD-10-CM | POA: Diagnosis not present

## 2021-10-16 DIAGNOSIS — F3181 Bipolar II disorder: Secondary | ICD-10-CM

## 2021-10-16 NOTE — Progress Notes (Signed)
[place] Counselor/Therapist Progress Note ? ?Patient ID: TAMISHIA DANTE, MRN: [id number],   ? ?Date: 10/16/2021 ? ?Time Spent: 60 minutes ? ?Treatment Type: Individual Therapy ? ?Reported Symptoms: sadness, worry ? ?Mental Status Exam: ?Appearance:  Casual     ?Behavior: Appropriate  ?Motor: Normal  ?Speech/Language:  Normal Rate  ?Affect: Blunt  ?Mood: normal  ?Thought process: normal  ?Thought content:   WNL  ?Sensory/Perceptual disturbances:   WNL  ?Orientation: oriented to person, place, time/date, and situation  ?Attention: Good  ?Concentration:  Good  ?Memory: WNL  ?Fund of knowledge:  Good  ?Insight:   Good  ?Judgment:  Good  ?Impulse Control: Good  ? ?Risk Assessment: ?Danger to Self:  No ?Self-injurious Behavior: No ?Danger to Others: No ?Duty to Warn:no ?Physical Aggression / Violence:No  ?Access to Firearms a concern: No  ?Gang Involvement:No  ? ?Subjective: The patient attended an individual therapy session via video visit today.  The patient gave verbal consent for the session to be on web ex.  Patient was in her home alone and therapist was in the office.  The patient presents as pleasant and cooperative.  The patient talked about things going well at present.  The patient reports that she has been basically ignoring anything related to her daughter and seems to be doing okay with this.  We talked about some other things that are going on in her life and they seem to be going well as well.  The patient seems to be managing her retirement and doing things that she wants to do.  The patient seems to have dealt much better with the fact that she is not able to see her granddaughter.  She needed some validation and some permission to go ahead and detach some from her daughter and her family.  She reports that she feels like she got that validation a few sessions ago.  We will continue to schedule appointments and provide patient with therapy as requested to help her cope with any  issues that might arise. ?Interventions: Cognitive Behavioral Therapy and Insight-Oriented ? ?Diagnosis:Adjustment disorder with mixed anxiety and depressed mood ? ?Bipolar II disorder ([place]) ? ?Plan: Please see plan in therapy charts with target date of 09/26/2022.  The patient has approved this treatment plan.   ? ?Brailon Don G Lunden Stieber, LCSW ? ? ? ? ? ? ? ? ? ? ? ? ? ? ? ? ? ?Heath Badon G Nishan Ovens, LCSW ? ? ? ? ? ? ? ? ? ? ? ? ? ? ?Shalita Notte G Djon Tith, LCSW ? ? ? ? ? ? ? ? ? ? ? ? ? ? ?Zahlia Deshazer G Karin Pinedo, LCSW ? ? ? ? ? ? ? ? ? ? ? ? ? ? ?Amour Trigg G Leandrew Keech, LCSW ?

## 2021-10-23 ENCOUNTER — Ambulatory Visit (INDEPENDENT_AMBULATORY_CARE_PROVIDER_SITE_OTHER): Payer: Medicare HMO | Admitting: Psychology

## 2021-10-23 DIAGNOSIS — F4323 Adjustment disorder with mixed anxiety and depressed mood: Secondary | ICD-10-CM

## 2021-10-23 DIAGNOSIS — F3181 Bipolar II disorder: Secondary | ICD-10-CM

## 2021-10-23 NOTE — Progress Notes (Signed)
[place] Counselor/Therapist Progress Note ? ?Patient ID: Kristie Nguyen, MRN: [id number],   ? ?Date: 10/23/2021 ? ?Time Spent: 50 minutes ? ?Treatment Type: Individual Therapy ? ?Reported Symptoms: sadness, worry ? ?Mental Status Exam: ?Appearance:  Casual     ?Behavior: Appropriate  ?Motor: Normal  ?Speech/Language:  Normal Rate  ?Affect: Blunt  ?Mood: normal  ?Thought process: normal  ?Thought content:   WNL  ?Sensory/Perceptual disturbances:   WNL  ?Orientation: oriented to person, place, time/date, and situation  ?Attention: Good  ?Concentration:  Good  ?Memory: WNL  ?Fund of knowledge:  Good  ?Insight:   Good  ?Judgment:  Good  ?Impulse Control: Good  ? ?Risk Assessment: ?Danger to Self:  No ?Self-injurious Behavior: No ?Danger to Others: No ?Duty to Warn:no ?Physical Aggression / Violence:No  ?Access to Firearms a concern: No  ?Gang Involvement:No  ? ?Subjective: The patient attended an individual therapy session via video visit today.  The patient gave verbal consent for the session to be on web ex.  Patient was in her home alone and therapist was in the office.  The patient presents as pleasant and cooperative.  The patient reports that she is doing well.  The patient states that she and her husband bought a new truck last week.  The patient is still considering whether she and her husband will buy a camper moving forward.  We talked about the pros and cons of doing so.  The patient seems to be managing her stress well she is not thinking too much about the relationship with her daughter that she struggles with.  She reports that she read an article about parents being estranged from their children and it seems that it is more common than she knew.  We scheduled appointments out every 2 weeks starting in May.   ? ?Interventions: Cognitive Behavioral Therapy and Insight-Oriented ? ?Diagnosis:Adjustment disorder with mixed anxiety and depressed mood ? ?Bipolar II disorder ([place]) ? ?Plan:  Please see plan in therapy charts with target date of 09/26/2022.  The patient has approved this treatment plan.   ? ?Alecxander Mainwaring G Naylah Cork, LCSW ? ? ? ? ? ? ? ? ? ? ? ? ? ? ? ? ? ?Teasia Zapf G Loreto Loescher, LCSW ? ? ? ? ? ? ? ? ? ? ? ? ? ? ?Tatiyanna Lashley G Rmani Kapusta, LCSW ? ? ? ? ? ? ? ? ? ? ? ? ? ? ?Adrick Kestler G Jaimy Kliethermes, LCSW ? ? ? ? ? ? ? ? ? ? ? ? ? ? ?Taequan Stockhausen G Kmarion Rawl, LCSW ? ? ? ? ? ? ? ? ? ? ? ? ? ? ?Mianna Iezzi G Shelley Pooley, LCSW ?

## 2021-11-06 ENCOUNTER — Ambulatory Visit (INDEPENDENT_AMBULATORY_CARE_PROVIDER_SITE_OTHER): Payer: Medicare HMO | Admitting: Psychology

## 2021-11-06 DIAGNOSIS — F3181 Bipolar II disorder: Secondary | ICD-10-CM | POA: Diagnosis not present

## 2021-11-06 DIAGNOSIS — F4323 Adjustment disorder with mixed anxiety and depressed mood: Secondary | ICD-10-CM

## 2021-11-07 NOTE — Progress Notes (Signed)
[facility] Counselor/Therapist Progress Note ? ?Patient ID: Kristie Nguyen, MRN: [id number],   ? ?Date: 11/06/2021 ? ?Time Spent: 60 minutes ? ?Treatment Type: Individual Therapy ? ?Reported Symptoms: sadness, worry ? ?Mental Status Exam: ?Appearance:  Casual     ?Behavior: Appropriate  ?Motor: Normal  ?Speech/Language:  Normal Rate  ?Affect: Blunt  ?Mood: normal  ?Thought process: normal  ?Thought content:   WNL  ?Sensory/Perceptual disturbances:   WNL  ?Orientation: oriented to person, place, time/date, and situation  ?Attention: Good  ?Concentration:  Good  ?Memory: WNL  ?Fund of knowledge:  Good  ?Insight:   Good  ?Judgment:  Good  ?Impulse Control: Good  ? ?Risk Assessment: ?Danger to Self:  No ?Self-injurious Behavior: No ?Danger to Others: No ?Duty to Warn:no ?Physical Aggression / Violence:No  ?Access to Firearms a concern: No  ?Gang Involvement:No  ? ?Subjective: The patient attended an individual therapy session via video visit today.  The patient gave verbal consent for the session to be on web ex.  Patient was in her home alone and therapist was in the office.  The patient presents as pleasant and cooperative.  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  We talked about how this did not feel good to the patient and also talked about this may be a blessing in disguise.  The patient seems to be doing very well with that dealing with the loss of her daughter and grandchild.  She seems to have a better understanding that her daughter is sick and that her daughter has a problem with some mental health issues.  The patient continues to do well in therapy and I will continue to provide supportive therapy as needed. ? ?Interventions: Cognitive Behavioral Therapy and Insight-Oriented ? ?Diagnosis:Adjustment  disorder with mixed anxiety and depressed mood ? ?Bipolar II disorder ([facility]) ? ?Plan: Please see plan in therapy charts with target date of 09/26/2022.  The patient has approved this treatment plan.   ? ?Danasha Melman G Sarthak Rubenstein, LCSW ? ? ? ? ? ? ? ? ? ? ? ? ? ? ? ? ? ?Karmon Andis G Omeka Holben, LCSW ? ? ? ? ? ? ? ? ? ? ? ? ? ? ?Nyles Mitton G Cassadie Pankonin, LCSW ? ? ? ? ? ? ? ? ? ? ? ? ? ? ?Jeral Zick G Selso Mannor, LCSW ? ? ? ? ? ? ? ? ? ? ? ? ? ? ?Lauriel Helin G Carlicia Leavens, LCSW ? ? ? ? ? ? ? ? ? ? ? ? ? ? ?Nikhita Mentzel G Meela Wareing, LCSW ? ? ? ? ? ? ? ? ? ? ? ? ? ? ?Elyan Vanwieren G Matias Thurman, LCSW ?

## 2021-11-13 ENCOUNTER — Ambulatory Visit: Payer: Medicare HMO | Admitting: Psychology

## 2021-11-28 ENCOUNTER — Ambulatory Visit (INDEPENDENT_AMBULATORY_CARE_PROVIDER_SITE_OTHER): Payer: Medicare HMO | Admitting: Psychology

## 2021-11-28 DIAGNOSIS — F3181 Bipolar II disorder: Secondary | ICD-10-CM

## 2021-11-28 DIAGNOSIS — F4323 Adjustment disorder with mixed anxiety and depressed mood: Secondary | ICD-10-CM

## 2021-11-28 NOTE — Progress Notes (Signed)
[facility] Counselor/Therapist Progress Note  Patient ID: Kristie Nguyen, MRN: [id number],    Date: 11/28/2021  Time Spent: 60 minutes  Treatment Type: Individual Therapy  Reported Symptoms: sadness, worry  Mental Status Exam: Appearance:  Casual     Behavior: Appropriate  Motor: Normal  Speech/Language:  Normal Rate  Affect: Blunt  Mood: normal  Thought process: normal  Thought content:   WNL  Sensory/Perceptual disturbances:   WNL  Orientation: oriented to person, place, time/date, and situation  Attention: Good  Concentration:  Good  Memory: WNL  Fund of knowledge:  Good  Insight:   Good  Judgment:  Good  Impulse Control: Good   Risk Assessment: Danger to Self:  No Self-injurious Behavior: No Danger to Others: No Duty to Warn:no Physical Aggression / Violence:No  Access to Firearms a concern: No  Gang Involvement:No   Subjective: The patient attended an individual therapy session via video visit today.  The patient gave verbal consent for the session to be on web ex.  Patient was in her home alone and therapist was in the office.  The patient presents as pleasant and cooperative.  The patient reports that she has been struggling lately with the situation with her daughter.  She talked about not knowing what she did that caused the problem.  What we did was to break it down and reevaluate the situation from her perspective that maybe she did not do anything wrong and possibly that her daughter was the one with the issue.  We talked about how she and her husband moved into the home with her daughter and her family and that is possible that they could have been jealous of their relationship that she and her husband had with her granddaughter and also upset because they were not necessarily doing what they need to do to manage their own home.  We talked about it as being a reminder that maybe she was failing as a mother and wife etc.  Encouraged the patient  to write it out and reframe it for herself so that she can gain better understanding and move through it easier.  Interventions: Cognitive Behavioral Therapy and Insight-Oriented  Diagnosis:Adjustment disorder with mixed anxiety and depressed mood  Bipolar II disorder (HCC)  Plan: Client Abilities/Strengths  Intelligent, ability for insight, good support from husband and church  Client Treatment Preferences  Outpatient individual video visits  Client Statement of Needs  " I need some help managing my depression"  Treatment Level  Outpatient Individual therapy  Symptoms  A family that is not a stable source of positive influence or support, since family members have little or  no contact with each other.: No Description Entered (Status: maintained). Strong emotional response of sadness exhibited when losses are discussed.: No Description Entered (Status: maintained).  Problems Addressed  Grief / Loss Unresolved, Family Conflict  Goals 1. Begin a healthy grieving process around the loss. Objective Verbalize resolution of feelings of guilt and regret associated with the loss. Target Date: 2022-09-26 Frequency: Weekly Progress: 0 Modality: individual Related Interventions 1. Therapy sessions to help patient deal with loss of relationship with grand daughter 2. Reach a level of reduced tension, increased satisfaction, and improved  communication with family and/or other authority figures. Diagnosis F43.23 (Adjustment Disorder, With mixed anxiety and  depressed mood) -  Adjustment Disorder, With Mixed  Anxiety and Depressed Mood  Conditions For Discharge Achievement of treatment goals and objectives  The patient has approved this treatment plan.  Raffaela Ladley G Laterra Lubinski, LCSW                  Oberon Hehir G Cha Gomillion, LCSW               Maryori Weide G Anelly Samarin, LCSW               Valdez Brannan G Victorino Fatzinger, LCSW               Malakye Nolden G Fields Oros,  LCSW               Asser Lucena G Idy Rawling, LCSW               Kristle Wesch G Tyshell Ramberg, LCSW               Shota Kohrs G Connie Hilgert, LCSW

## 2021-12-11 ENCOUNTER — Ambulatory Visit (INDEPENDENT_AMBULATORY_CARE_PROVIDER_SITE_OTHER): Payer: Medicare HMO | Admitting: Psychology

## 2021-12-11 DIAGNOSIS — F3181 Bipolar II disorder: Secondary | ICD-10-CM

## 2021-12-11 DIAGNOSIS — F4323 Adjustment disorder with mixed anxiety and depressed mood: Secondary | ICD-10-CM

## 2021-12-11 NOTE — Progress Notes (Signed)
[facility] Counselor/Therapist Progress Note  Patient ID: Kristie Nguyen, MRN: [id number],    Date: 12/11/2021  Time Spent: 60 minutes  Treatment Type: Individual Therapy  Reported Symptoms: sadness, worry  Mental Status Exam: Appearance:  Casual     Behavior: Appropriate  Motor: Normal  Speech/Language:  Normal Rate  Affect: Blunt  Mood: normal  Thought process: normal  Thought content:   WNL  Sensory/Perceptual disturbances:   WNL  Orientation: oriented to person, place, time/date, and situation  Attention: Good  Concentration:  Good  Memory: WNL  Fund of knowledge:  Good  Insight:   Good  Judgment:  Good  Impulse Control: Good   Risk Assessment: Danger to Self:  No Self-injurious Behavior: No Danger to Others: No Duty to Warn:no Physical Aggression / Violence:No  Access to Firearms a concern: No  Gang Involvement:No   Subjective: The patient attended an individual therapy session via video visit today.  The patient gave verbal consent for the session to be on web ex.  Patient was in her home alone and therapist was in the office.  The patient presents as pleasant and cooperative.  The patient reports that she had a little bit of a hiccup about her daughter over the last few weeks.  She reports that she had gotten some pictures together and had the intention of taking those pictures to her lawyers office and having them contact her daughter to come pick them up.  Fortunately she spoke with her husband and he recommended that she not do that.  We talked about this being 1 of those things that she might perceive incorrectly and that would cause her more angst.  We processed again what it seems might have happened with her daughter and I recommended that she always runs what ever she is thinking about by her husband before she actually acts on it.  The patient is doing well otherwise and seems to be doing much better with the situation with her daughter and  her granddaughter.  Interventions: Cognitive Behavioral Therapy and Insight-Oriented  Diagnosis:Bipolar II disorder (HCC)  Adjustment disorder with mixed anxiety and depressed mood  Plan: Client Abilities/Strengths  Intelligent, ability for insight, good support from husband and church  Client Treatment Preferences  Outpatient individual video visits  Client Statement of Needs  " I need some help managing my depression"  Treatment Level  Outpatient Individual therapy  Symptoms  A family that is not a stable source of positive influence or support, since family members have little or  no contact with each other.: No Description Entered (Status: maintained). Strong emotional response of sadness exhibited when losses are discussed.: No Description Entered (Status: maintained).  Problems Addressed  Grief / Loss Unresolved, Family Conflict  Goals 1. Begin a healthy grieving process around the loss. Objective Verbalize resolution of feelings of guilt and regret associated with the loss. Target Date: 2022-09-26 Frequency: Weekly Progress: 0 Modality: individual Related Interventions 1. Therapy sessions to help patient deal with loss of relationship with grand daughter 2. Reach a level of reduced tension, increased satisfaction, and improved  communication with family and/or other authority figures. Diagnosis F43.23 (Adjustment Disorder, With mixed anxiety and  depressed mood) -  Adjustment Disorder, With Mixed  Anxiety and Depressed Mood  Conditions For Discharge Achievement of treatment goals and objectives  The patient has approved this treatment plan.    Jaqlyn Gruenhagen G Hernandez Losasso, LCSW  Kadyn Chovan G Estil Vallee, LCSW               Sherlin Sonier G Treyvin Glidden, LCSW               Omelia Marquart G Wilder Amodei, LCSW               Elias Dennington G Bruin Bolger, LCSW               Beauden Tremont G Akshita Italiano, LCSW               Brandom Kerwin G Gwin Eagon,  LCSW               Daltin Crist G Parv Manthey, LCSW               Tandy Grawe G Pryce Folts, LCSW

## 2021-12-25 ENCOUNTER — Ambulatory Visit (INDEPENDENT_AMBULATORY_CARE_PROVIDER_SITE_OTHER): Payer: Medicare HMO | Admitting: Psychology

## 2021-12-25 DIAGNOSIS — F3181 Bipolar II disorder: Secondary | ICD-10-CM | POA: Diagnosis not present

## 2021-12-25 DIAGNOSIS — F4323 Adjustment disorder with mixed anxiety and depressed mood: Secondary | ICD-10-CM | POA: Diagnosis not present

## 2021-12-26 NOTE — Progress Notes (Signed)
[facility] Counselor/Therapist Progress Note  Patient ID: Kristie Nguyen, MRN: [id number],    Date: 12/25/2021  Time Spent: 60 minutes  Treatment Type: Individual Therapy  Reported Symptoms: sadness, worry  Mental Status Exam: Appearance:  Casual     Behavior: Appropriate  Motor: Normal  Speech/Language:  Normal Rate  Affect: Blunt  Mood: normal  Thought process: normal  Thought content:   WNL  Sensory/Perceptual disturbances:   WNL  Orientation: oriented to person, place, time/date, and situation  Attention: Good  Concentration:  Good  Memory: WNL  Fund of knowledge:  Good  Insight:   Good  Judgment:  Good  Impulse Control: Good   Risk Assessment: Danger to Self:  No Self-injurious Behavior: No Danger to Others: No Duty to Warn:no Physical Aggression / Violence:No  Access to Firearms a concern: No  Gang Involvement:No   Subjective: The patient attended an individual therapy session via video visit today.  The patient gave verbal consent for the session to be on web ex.  Patient was in her home alone and therapist was in the office.  The patient presents as pleasant and cooperative.  The patient reports that she has been doing much better with dealing with the situation with her daughter and granddaughter.  She does state that she misses her granddaughter.  She states that she has been able to reframe in her mind the situation and is managing her thoughts better and not nearly as depressed about it.  We talked about how to manage her thoughts and I recommended the book, Switch on your Brain.  Interventions: Cognitive Behavioral Therapy and Insight-Oriented  Diagnosis:Bipolar II disorder ([facility])  Adjustment disorder with mixed anxiety and depressed mood  Plan: Client Abilities/Strengths  Intelligent, ability for insight, good support from husband and church  Client Treatment Preferences  Outpatient individual video visits  Client Statement of  Needs  " I need some help managing my depression"  Treatment Level  Outpatient Individual therapy  Symptoms  A family that is not a stable source of positive influence or support, since family members have little or  no contact with each other.: No Description Entered (Status: maintained). Strong emotional response of sadness exhibited when losses are discussed.: No Description Entered (Status: maintained).  Problems Addressed  Grief / Loss Unresolved, Family Conflict  Goals 1. Begin a healthy grieving process around the loss. Objective Verbalize resolution of feelings of guilt and regret associated with the loss. Target Date: 2022-09-26 Frequency: Weekly Progress: 0 Modality: individual Related Interventions 1. Therapy sessions to help patient deal with loss of relationship with grand daughter 2. Reach a level of reduced tension, increased satisfaction, and improved  communication with family and/or other authority figures. Diagnosis F43.23 (Adjustment Disorder, With mixed anxiety and  depressed mood) -  Adjustment Disorder, With Mixed  Anxiety and Depressed Mood  Conditions For Discharge Achievement of treatment goals and objectives  The patient has approved this treatment plan.    Lashanda Storlie G Jeffrie Lofstrom, LCSW                  Abdoulie Tierce G Kyran Whittier, LCSW               Craig Wisnewski G Lalla Laham, LCSW               Mischa Brittingham G Estephany Perot, LCSW               Rhianna Raulerson G Analyn Matusek, LCSW  Daxten Kovalenko G Maghen Group, LCSW               Kendryck Lacroix G Jeorgia Helming, LCSW               Linville Decarolis G Treyden Hakim, LCSW               Faydra Korman G Kijuan Gallicchio, LCSW               Aarilyn Dye G Qusay Villada, LCSW

## 2022-01-08 ENCOUNTER — Ambulatory Visit (INDEPENDENT_AMBULATORY_CARE_PROVIDER_SITE_OTHER): Payer: Medicare HMO | Admitting: Psychology

## 2022-01-08 DIAGNOSIS — F4323 Adjustment disorder with mixed anxiety and depressed mood: Secondary | ICD-10-CM

## 2022-01-08 DIAGNOSIS — F330 Major depressive disorder, recurrent, mild: Secondary | ICD-10-CM | POA: Diagnosis not present

## 2022-01-08 NOTE — Progress Notes (Signed)
[facility] Counselor/Therapist Progress Note  Patient ID: Kristie Nguyen, MRN: [id number],    Date: 01/08/2022  Time Spent: 60 minutes  Treatment Type: Individual Therapy  Reported Symptoms: sadness, worry  Mental Status Exam: Appearance:  Casual     Behavior: Appropriate  Motor: Normal  Speech/Language:  Normal Rate  Affect: Blunt  Mood: normal  Thought process: normal  Thought content:   WNL  Sensory/Perceptual disturbances:   WNL  Orientation: oriented to person, place, time/date, and situation  Attention: Good  Concentration:  Good  Memory: WNL  Fund of knowledge:  Good  Insight:   Good  Judgment:  Good  Impulse Control: Good   Risk Assessment: Danger to Self:  No Self-injurious Behavior: No Danger to Others: No Duty to Warn:no Physical Aggression / Violence:No  Access to Firearms a concern: No  Gang Involvement:No   Subjective: The patient attended an individual therapy session via video visit today.  The patient gave verbal consent for the session to be on web ex.  Patient was in her home alone and therapist was in the office.  The patient presents as pleasant and cooperative.  The patient reports that she really felt that Switch on Your Brain was helpful.  She had lunch with a friend today who knows her daughter and she started talking negatively again.  We talked about how this was not helpful to her and talked about how to do the detox part of the book with negative thoughts.  She plans to implement this detox in the next few weeks.  Interventions: Cognitive Behavioral Therapy and Insight-Oriented  Diagnosis:Adjustment disorder with mixed anxiety and depressed mood  Major depressive disorder, recurrent, mild (HCC)  Plan: Client Abilities/Strengths  Intelligent, ability for insight, good support from husband and church  Client Treatment Preferences  Outpatient individual video visits  Client Statement of Needs  " I need some  help managing my depression"  Treatment Level  Outpatient Individual therapy  Symptoms  A family that is not a stable source of positive influence or support, since family members have little or  no contact with each other.: No Description Entered (Status: maintained). Strong emotional response of sadness exhibited when losses are discussed.: No Description Entered (Status: maintained).  Problems Addressed  Grief / Loss Unresolved, Family Conflict  Goals 1. Begin a healthy grieving process around the loss. Objective Verbalize resolution of feelings of guilt and regret associated with the loss. Target Date: 2022-09-26 Frequency: Weekly Progress: 0 Modality: individual Related Interventions 1. Therapy sessions to help patient deal with loss of relationship with grand daughter 2. Reach a level of reduced tension, increased satisfaction, and improved  communication with family and/or other authority figures. Diagnosis F43.23 (Adjustment Disorder, With mixed anxiety and  depressed mood) -  Adjustment Disorder, With Mixed  Anxiety and Depressed Mood  Conditions For Discharge Achievement of treatment goals and objectives  The patient has approved this treatment plan.    Jeidi Gilles G Shyleigh Daughtry, LCSW                  Wilder Kurowski G Jayda White, LCSW               Aretha Levi G Sharlotte Baka, LCSW               Hilarie Sinha G Paulo Keimig, LCSW               Raha Tennison G Dereck Agerton, LCSW  Casmere Hollenbeck G Kinberly Perris, LCSW               Prabhleen Montemayor G Dakwon Wenberg, LCSW               Sagrario Lineberry G Talya Quain, LCSW               Tameshia Bonneville G Cassara Nida, LCSW               Josuel Koeppen G Jermine Bibbee, LCSW           Demaree Liberto G Turkessa Ostrom, LCSW

## 2022-01-22 ENCOUNTER — Ambulatory Visit: Payer: Medicare HMO | Admitting: Psychology

## 2022-01-22 DIAGNOSIS — F4323 Adjustment disorder with mixed anxiety and depressed mood: Secondary | ICD-10-CM | POA: Diagnosis not present

## 2022-01-23 NOTE — Progress Notes (Signed)
[facility] Counselor/Therapist Progress Note  Patient ID: Kristie Nguyen, MRN: [id number],    Date: 01/22/2022  Time Spent: 60 minutes  Treatment Type: Individual Therapy  Reported Symptoms: sadness, worry  Mental Status Exam: Appearance:  Casual     Behavior: Appropriate  Motor: Normal  Speech/Language:  Normal Rate  Affect: Blunt  Mood: normal  Thought process: normal  Thought content:   WNL  Sensory/Perceptual disturbances:   WNL  Orientation: oriented to person, place, time/date, and situation  Attention: Good  Concentration:  Good  Memory: WNL  Fund of knowledge:  Good  Insight:   Good  Judgment:  Good  Impulse Control: Good   Risk Assessment: Danger to Self:  No Self-injurious Behavior: No Danger to Others: No Duty to Warn:no Physical Aggression / Violence:No  Access to Firearms a concern: No  Gang Involvement:No   Subjective: The patient attended an individual therapy session via video visit today.  The patient gave verbal consent for the session to be on web ex.  Patient was in her home alone and therapist was in the office.  The patient presents as pleasant and cooperative.  The patient reports that she continues to work on changing her negative thinking to something neutral or positive.  The patient talked about how difficult it is to be able to recognize when you are thinking negatively and that is just been a basic part of her life for a long time.  We talked about techniques that she can use in order to change her thought process.  The patient is making good progress with therapy. Interventions: Cognitive Behavioral Therapy and Insight-Oriented  Diagnosis:Adjustment disorder with mixed anxiety and depressed mood  Plan: Client Abilities/Strengths  Intelligent, ability for insight, good support from husband and church  Client Treatment Preferences  Outpatient individual video visits  Client Statement of Needs  " I need some  help managing my depression"  Treatment Level  Outpatient Individual therapy  Symptoms  A family that is not a stable source of positive influence or support, since family members have little or  no contact with each other.: No Description Entered (Status: maintained). Strong emotional response of sadness exhibited when losses are discussed.: No Description Entered (Status: maintained).  Problems Addressed  Grief / Loss Unresolved, Family Conflict  Goals 1. Begin a healthy grieving process around the loss. Objective Verbalize resolution of feelings of guilt and regret associated with the loss. Target Date: 2022-09-26 Frequency: Weekly Progress: 0 Modality: individual Related Interventions 1. Therapy sessions to help patient deal with loss of relationship with grand daughter 2. Reach a level of reduced tension, increased satisfaction, and improved  communication with family and/or other authority figures. Diagnosis F43.23 (Adjustment Disorder, With mixed anxiety and  depressed mood) -  Adjustment Disorder, With Mixed  Anxiety and Depressed Mood  Conditions For Discharge Achievement of treatment goals and objectives  The patient has approved this treatment plan.    Jaclyne Haverstick G Niesha Bame, LCSW                  Akito Boomhower G Aliesha Dolata, LCSW               Juni Glaab G Randilyn Foisy, LCSW               Raidyn Wassink G Aimee Heldman, LCSW               Ahan Eisenberger G Alaycia Eardley, LCSW  Klare Criss G Kambryn Dapolito, LCSW               Royalti Schauf G Kalijah Westfall, LCSW               Havish Petties G Mackensey Bolte, LCSW               Zannie Runkle G Keela Rubert, LCSW               Kallen Delatorre G Teneka Malmberg, LCSW           Kevionna Heffler G Marigrace Mccole, LCSW               Tami Barren G Naveyah Iacovelli, LCSW

## 2022-01-30 ENCOUNTER — Ambulatory Visit: Payer: Medicare HMO | Admitting: Psychology

## 2022-02-13 ENCOUNTER — Ambulatory Visit (INDEPENDENT_AMBULATORY_CARE_PROVIDER_SITE_OTHER): Payer: Medicare HMO | Admitting: Psychology

## 2022-02-13 DIAGNOSIS — F330 Major depressive disorder, recurrent, mild: Secondary | ICD-10-CM

## 2022-02-13 DIAGNOSIS — F4323 Adjustment disorder with mixed anxiety and depressed mood: Secondary | ICD-10-CM | POA: Diagnosis not present

## 2022-02-14 NOTE — Progress Notes (Signed)
[facility] Counselor/Therapist Progress Note  Patient ID: Kristie Nguyen, MRN: [id number],    Date: 02/13/2022  Time Spent: 60 minutes  Treatment Type: Individual Therapy  Reported Symptoms: sadness, worry  Mental Status Exam: Appearance:  Casual     Behavior: Appropriate  Motor: Normal  Speech/Language:  Normal Rate  Affect: Blunt  Mood: normal  Thought process: normal  Thought content:   WNL  Sensory/Perceptual disturbances:   WNL  Orientation: oriented to person, place, time/date, and situation  Attention: Good  Concentration:  Good  Memory: WNL  Fund of knowledge:  Good  Insight:   Good  Judgment:  Good  Impulse Control: Good   Risk Assessment: Danger to Self:  No Self-injurious Behavior: No Danger to Others: No Duty to Warn:no Physical Aggression / Violence:No  Access to Firearms a concern: No  Gang Involvement:No   Subjective: The patient attended an individual therapy session via video visit today.  The patient gave verbal consent for the session to be on web ex.  Patient was in her home alone and therapist was in the office.  The patient presents as pleasant and cooperative.  The patient reports that she was in [place] for about 10 days and unfortunately she got sick while she was up there.  She states that she had a difficult time when she was sick being able to work with herself on her negative thinking.  We came up with a strategy to help her pay attention when she is getting more and more frustrated and negative.  We talked about her coming up with a mantra to be able to pull herself back so that she does not allow herself to go down those negative pathways.  We will continue to talk with patient about changing her thinking and remaining as positive as possible.  interventions: Cognitive Behavioral Therapy and Insight-Oriented  Diagnosis:Adjustment disorder with mixed anxiety and depressed mood  Major depressive disorder, recurrent,  mild (HCC)  Plan: Client Abilities/Strengths  Intelligent, ability for insight, good support from husband and church  Client Treatment Preferences  Outpatient individual video visits  Client Statement of Needs  " I need some help managing my depression"  Treatment Level  Outpatient Individual therapy  Symptoms  A family that is not a stable source of positive influence or support, since family members have little or  no contact with each other.: No Description Entered (Status: maintained). Strong emotional response of sadness exhibited when losses are discussed.: No Description Entered (Status: maintained).  Problems Addressed  Grief / Loss Unresolved, Family Conflict  Goals 1. Begin a healthy grieving process around the loss. Objective Verbalize resolution of feelings of guilt and regret associated with the loss. Target Date: 2022-09-26 Frequency: Weekly Progress: 0 Modality: individual Related Interventions 1. Therapy sessions to help patient deal with loss of relationship with grand daughter 2. Reach a level of reduced tension, increased satisfaction, and improved  communication with family and/or other authority figures. Diagnosis F43.23 (Adjustment Disorder, With mixed anxiety and  depressed mood) -  Adjustment Disorder, With Mixed  Anxiety and Depressed Mood  Conditions For Discharge Achievement of treatment goals and objectives  The patient has approved this treatment plan.    Camren Henthorn G Jac Romulus, LCSW                  Courtez Twaddle G Chianti Goh, LCSW               Anajulia Leyendecker G Claudia Greenley, LCSW  Keiden Deskin G Harun Brumley, LCSW               Mohamed Portlock G Stephane Niemann, LCSW               Toribio Seiber G Tonisha Silvey, LCSW               Coston Mandato G Maysoon Lozada, LCSW               Elijio Staples G Amarya Kuehl, LCSW               Mry Lamia G Jered Heiny, LCSW               Tiffany Calmes G Taesean Reth, LCSW           Amore Ackman G  Kolten Ryback, LCSW               Keyvin Rison G Doren Kaspar, LCSW               Dorella Laster G Erman Thum, LCSW

## 2022-03-01 ENCOUNTER — Ambulatory Visit: Payer: Medicare HMO | Admitting: Psychology

## 2022-03-01 DIAGNOSIS — F330 Major depressive disorder, recurrent, mild: Secondary | ICD-10-CM | POA: Diagnosis not present

## 2022-03-01 DIAGNOSIS — F4323 Adjustment disorder with mixed anxiety and depressed mood: Secondary | ICD-10-CM | POA: Diagnosis not present

## 2022-03-03 NOTE — Progress Notes (Signed)
[facility] Counselor/Therapist Progress Note  Patient ID: Kristie Nguyen, MRN: [id number],    Date: 03/01/2022  Time Spent: 60 minutes  Treatment Type: Individual Therapy  Reported Symptoms: sadness, worry  Mental Status Exam: Appearance:  Casual     Behavior: Appropriate  Motor: Normal  Speech/Language:  Normal Rate  Affect: Blunt  Mood: normal  Thought process: normal  Thought content:   WNL  Sensory/Perceptual disturbances:   WNL  Orientation: oriented to person, place, time/date, and situation  Attention: Good  Concentration:  Good  Memory: WNL  Fund of knowledge:  Good  Insight:   Good  Judgment:  Good  Impulse Control: Good   Risk Assessment: Danger to Self:  No Self-injurious Behavior: No Danger to Others: No Duty to Warn:no Physical Aggression / Violence:No  Access to Firearms a concern: No  Gang Involvement:No   Subjective: The patient attended an individual therapy session via video visit today.  The patient gave verbal consent for the session to be on web ex.  Patient was in her home alone and therapist was in the office.  The patient presents as pleasant and cooperative.  The patient reports that she is doing well.  She continues to work with herself on changing her negative thoughts and feels that she is doing much better since she has been doing this.  She talked about her husband going up to [place] to see their son.  The patient states that she feels that she has been handling things much better about her daughter and grand daughter.    Interventions: Cognitive Behavioral Therapy and Insight-Oriented  Diagnosis:Adjustment disorder with mixed anxiety and depressed mood  Major depressive disorder, recurrent, mild (HCC)  Plan: Client Abilities/Strengths  Intelligent, ability for insight, good support from husband and church  Client Treatment Preferences  Outpatient individual video visits  Client Statement of Needs  " I  need some help managing my depression"  Treatment Level  Outpatient Individual therapy  Symptoms  A family that is not a stable source of positive influence or support, since family members have little or  no contact with each other.: No Description Entered (Status: maintained). Strong emotional response of sadness exhibited when losses are discussed.: No Description Entered (Status: maintained).  Problems Addressed  Grief / Loss Unresolved, Family Conflict  Goals 1. Begin a healthy grieving process around the loss. Objective Verbalize resolution of feelings of guilt and regret associated with the loss. Target Date: 2022-09-26 Frequency: Weekly Progress: 0 Modality: individual Related Interventions 1. Therapy sessions to help patient deal with loss of relationship with grand daughter 2. Reach a level of reduced tension, increased satisfaction, and improved  communication with family and/or other authority figures. Diagnosis F43.23 (Adjustment Disorder, With mixed anxiety and  depressed mood) -  Adjustment Disorder, With Mixed  Anxiety and Depressed Mood  Conditions For Discharge Achievement of treatment goals and objectives  The patient has approved this treatment plan.    Janesia Joswick G Tamira Ryland, LCSW                  Nobel Brar G Brookelynne Dimperio, LCSW               Joren Rehm G Elyce Zollinger, LCSW               Quenesha Douglass G Gevon Markus, LCSW               Athziry Millican G Sheniya Garciaperez, LCSW  Aaliyana Fredericks G Lamarcus Spira, LCSW               Clarissia Mckeen G Abbott Jasinski, LCSW               Shanique Aslinger G Jordanna Hendrie, LCSW               Wadell Craddock G Fayrene Towner, LCSW               Sylvan Lahm G Ayvah Caroll, LCSW           Stana Bayon G Lemario Chaikin, LCSW               Lynee Rosenbach G Colden Samaras, LCSW               Moriah Shawley G Drexler Maland, LCSW               Matther Labell G Micco Bourbeau, LCSW

## 2022-03-13 ENCOUNTER — Ambulatory Visit (INDEPENDENT_AMBULATORY_CARE_PROVIDER_SITE_OTHER): Payer: Medicare HMO | Admitting: Psychology

## 2022-03-13 DIAGNOSIS — F330 Major depressive disorder, recurrent, mild: Secondary | ICD-10-CM

## 2022-03-13 DIAGNOSIS — F4323 Adjustment disorder with mixed anxiety and depressed mood: Secondary | ICD-10-CM

## 2022-03-14 NOTE — Progress Notes (Signed)
[facility] Counselor/Therapist Progress Note  Patient ID: FREDERICA CHRESTMAN, MRN: [id number],    Date: 03/13/2022  Time Spent: 60 minutes  Treatment Type: Individual Therapy  Reported Symptoms: sadness, worry  Mental Status Exam: Appearance:  Casual     Behavior: Appropriate  Motor: Normal  Speech/Language:  Normal Rate  Affect: Blunt  Mood: normal  Thought process: normal  Thought content:   WNL  Sensory/Perceptual disturbances:   WNL  Orientation: oriented to person, place, time/date, and situation  Attention: Good  Concentration:  Good  Memory: WNL  Fund of knowledge:  Good  Insight:   Good  Judgment:  Good  Impulse Control: Good   Risk Assessment: Danger to Self:  No Self-injurious Behavior: No Danger to Others: No Duty to Warn:no Physical Aggression / Violence:No  Access to Firearms a concern: No  Gang Involvement:No   Subjective: The patient attended an individual therapy session via video visit today.  The patient gave verbal consent for the session to be on web ex.  Patient was in her home alone and therapist was in the office.  The patient presents as pleasant and cooperative.  The patient reports that she is doing well and working with changing her thought patterns.  The patient talked about her son and daughter-in-law and some dynamics that they have going on in their relationship.  I worked with the patient on understanding herself better as she tends to take on responsibility for making people upset and for other people's feelings.  We talked about her understanding that everybody has their own agenda and we talked about that people operate from their own issues.  The patient continues to work with keeping her thoughts neutral or positive and this has been very helpful in dealing with the situation with her daughter and granddaughter.  Interventions: Cognitive Behavioral Therapy and Insight-Oriented  Diagnosis:Adjustment disorder with mixed  anxiety and depressed mood  Major depressive disorder, recurrent, mild (HCC)  Plan: Client Abilities/Strengths  Intelligent, ability for insight, good support from husband and church  Client Treatment Preferences  Outpatient individual video visits  Client Statement of Needs  " I need some help managing my depression"  Treatment Level  Outpatient Individual therapy  Symptoms  A family that is not a stable source of positive influence or support, since family members have little or  no contact with each other.: No Description Entered (Status: maintained). Strong emotional response of sadness exhibited when losses are discussed.: No Description Entered (Status: maintained).  Problems Addressed  Grief / Loss Unresolved, Family Conflict  Goals 1. Begin a healthy grieving process around the loss. Objective Verbalize resolution of feelings of guilt and regret associated with the loss. Target Date: 2022-09-26 Frequency: Weekly Progress: 0 Modality: individual Related Interventions 1. Therapy sessions to help patient deal with loss of relationship with grand daughter 2. Reach a level of reduced tension, increased satisfaction, and improved  communication with family and/or other authority figures. Diagnosis F43.23 (Adjustment Disorder, With mixed anxiety and  depressed mood) -  Adjustment Disorder, With Mixed  Anxiety and Depressed Mood  Conditions For Discharge Achievement of treatment goals and objectives  The patient has approved this treatment plan.    Antania Hoefling G Harol Shabazz, LCSW  Chozen Latulippe G Rielynn Trulson, LCSW

## 2022-04-09 ENCOUNTER — Ambulatory Visit: Payer: Medicare HMO | Admitting: Psychology

## 2022-04-09 DIAGNOSIS — F4323 Adjustment disorder with mixed anxiety and depressed mood: Secondary | ICD-10-CM | POA: Diagnosis not present

## 2022-04-10 NOTE — Progress Notes (Signed)
[facility] Counselor/Therapist Progress Note  Patient ID: Kristie Nguyen, MRN: [id number],    Date: 04/09/2022  Time Spent: 60 minutes  Treatment Type: Individual Therapy  Reported Symptoms: sadness, worry  Mental Status Exam: Appearance:  Casual     Behavior: Appropriate  Motor: Normal  Speech/Language:  Normal Rate  Affect: Blunt  Mood: sad  Thought process: normal  Thought content:   WNL  Sensory/Perceptual disturbances:   WNL  Orientation: oriented to person, place, time/date, and situation  Attention: Good  Concentration:  Good  Memory: WNL  Fund of knowledge:  Good  Insight:   Good  Judgment:  Good  Impulse Control: Good   Risk Assessment: Danger to Self:  No Self-injurious Behavior: No Danger to Others: No Duty to Warn:no Physical Aggression / Violence:No  Access to Firearms a concern: No  Gang Involvement:No   Subjective: The patient attended an individual therapy session via video visit today.  The patient gave verbal consent for the session to be on web ex.  Patient was in her home alone and therapist was in the office.  The patient presents as pleasant and cooperative, but had moments of sadness during the session.  The patient reports that it is her grand daughters birthday and she feels sad about it.  We talked about it bing understandable that she would have sadness.  We talked about it being healthier for her to actually separate out her love for her grand daughter from the behavior of her mother.  I recommended that she embrace the love that she has for her granddaughter and be aware that her grand daughter did not have anything to do with the situation with her daughter.  The patient stated that she would need to mull this around a little.  She did get a little tearful when we were discussing this and she said that thinking about it is too painful.  We talked about the importance of her keeping her love for her granddaughter separate  from the pain in order to have better energy around the situation.   The patient reported that she would need to mull it around.  Interventions: Cognitive Behavioral Therapy and Insight-Oriented  Diagnosis:Adjustment disorder with mixed anxiety and depressed mood  Plan: Client Abilities/Strengths  Intelligent, ability for insight, good support from husband and church  Client Treatment Preferences  Outpatient individual video visits  Client Statement of Needs  " I need some help managing my depression"  Treatment Level  Outpatient Individual therapy  Symptoms  A family that is not a stable source of positive influence or support, since family members have little or  no contact with each other.: No Description Entered (Status: maintained). Strong emotional response of sadness exhibited when losses are discussed.: No Description Entered (Status: maintained).  Problems Addressed  Grief / Loss Unresolved, Family Conflict  Goals 1. Begin a healthy grieving process around the loss. Objective Verbalize resolution of feelings of guilt and regret associated with the loss. Target Date: 2022-09-26 Frequency: Weekly Progress: 0 Modality: individual Related Interventions 1. Therapy sessions to help patient deal with loss of relationship with grand daughter 2. Reach a level of reduced tension, increased satisfaction, and improved  communication with family and/or other authority figures. Diagnosis F43.23 (Adjustment Disorder, With mixed anxiety and  depressed mood) -  Adjustment Disorder, With Mixed  Anxiety and Depressed Mood  Conditions For Discharge Achievement of treatment goals and objectives  The patient has approved this treatment plan.  Sharise Lippy G Shea Kapur, LCSW

## 2022-04-24 ENCOUNTER — Ambulatory Visit: Payer: Medicare HMO | Admitting: Psychology

## 2022-04-24 DIAGNOSIS — F330 Major depressive disorder, recurrent, mild: Secondary | ICD-10-CM

## 2022-04-24 NOTE — Progress Notes (Signed)
[facility] Counselor/Therapist Progress Note  Patient ID: ARWA YERO, MRN: [id number],    Date: 04/24/2022  Time Spent: 60 minutes  Treatment Type: Individual Therapy  Reported Symptoms: sadness, worry  Mental Status Exam: Appearance:  Casual     Behavior: Appropriate  Motor: Normal  Speech/Language:  Normal Rate  Affect: Blunt  Mood: Pleasant  Thought process: normal  Thought content:   WNL  Sensory/Perceptual disturbances:   WNL  Orientation: oriented to person, place, time/date, and situation  Attention: Good  Concentration:  Good  Memory: WNL  Fund of knowledge:  Good  Insight:   Good  Judgment:  Good  Impulse Control: Good   Risk Assessment: Danger to Self:  No Self-injurious Behavior: No Danger to Others: No Duty to Warn:no Physical Aggression / Violence:No  Access to Firearms a concern: No  Gang Involvement:No   Subjective: The patient attended an individual therapy session via video visit today.  The patient gave verbal consent for the session to be on web ex.  Patient was in her home alone and therapist was in the office.  The patient presents as pleasant and cooperative today.  The patient reports that she feels much better about her situation with her granddaughter since we talked the last time and has been able to implement the unconditional love that we talked about her implementing and separating from her daughters problems.  She did report that her daughter contacted her husband through [employer] and told him that she had filed an adult adoption and she is no longer their child and the granddaughter is no longer their granddaughter.  Apparently this is a violation of the agreement that was made with the attorney and the restraining order.  We processed how the patient felt about this and she seems to be doing okay with that even though this recently happened over the weekend.  We talked about her daughter just being borderline and I  recommended that she consider reading the book I hate you, Don't leave me.  I explained to her that if it was too overwhelming that she could put it down if she did not feel like she could do that.  The patient seems to be managing her emotions well and we scheduled some more sessions.  Interventions: Cognitive Behavioral Therapy and Insight-Oriented  Diagnosis:Major depressive disorder, recurrent, mild ([place])  Plan: Client Abilities/Strengths  Intelligent, ability for insight, good support from husband and church  Client Treatment Preferences  Outpatient individual video visits  Client Statement of Needs  " I need some help managing my depression"  Treatment Level  Outpatient Individual therapy  Symptoms  A family that is not a stable source of positive influence or support, since family members have little or  no contact with each other.: No Description Entered (Status: maintained). Strong emotional response of sadness exhibited when losses are discussed.: No Description Entered (Status: maintained).  Problems Addressed  Grief / Loss Unresolved, Family Conflict  Goals 1. Begin a healthy grieving process around the loss. Objective Verbalize resolution of feelings of guilt and regret associated with the loss. Target Date: 2022-09-26 Frequency: Weekly Progress: 50 Modality: individual Related Interventions 1. Therapy sessions to help patient deal with loss of relationship with grand daughter 2. Reach a level of reduced tension, increased satisfaction, and improved  communication with family and/or other authority figures. Diagnosis F43.23 (Adjustment Disorder, With mixed anxiety and  depressed mood) -  Adjustment Disorder, With Mixed  Anxiety and Depressed Mood  Conditions For Discharge Achievement of treatment goals and objectives  The patient has approved this treatment plan.    Heidie Krall G Aleksei Goodlin,  LCSW

## 2022-05-08 ENCOUNTER — Ambulatory Visit: Payer: Medicare HMO | Admitting: Psychology

## 2022-05-08 DIAGNOSIS — F4323 Adjustment disorder with mixed anxiety and depressed mood: Secondary | ICD-10-CM

## 2022-05-08 DIAGNOSIS — F330 Major depressive disorder, recurrent, mild: Secondary | ICD-10-CM | POA: Diagnosis not present

## 2022-05-09 NOTE — Progress Notes (Signed)
[facility] Counselor/Therapist Progress Note  Patient ID: Kristie Nguyen, MRN: [id number],    Date: 05/08/2022  Time Spent: 60 minutes  Treatment Type: Individual Therapy  Reported Symptoms: sadness, worry  Mental Status Exam: Appearance:  Casual     Behavior: Appropriate  Motor: Normal  Speech/Language:  Normal Rate  Affect: Blunt  Mood: Pleasant  Thought process: normal  Thought content:   WNL  Sensory/Perceptual disturbances:   WNL  Orientation: oriented to person, place, time/date, and situation  Attention: Good  Concentration:  Good  Memory: WNL  Fund of knowledge:  Good  Insight:   Good  Judgment:  Good  Impulse Control: Good   Risk Assessment: Danger to Self:  No Self-injurious Behavior: No Danger to Others: No Duty to Warn:no Physical Aggression / Violence:No  Access to Firearms a concern: No  Gang Involvement:No   Subjective: The patient attended an individual therapy session via video visit today.  The patient gave verbal consent for the session to be on web ex.  Patient was in her home alone and therapist was in the office.  The patient presents as pleasant and cooperative today.  The patient reports that she and her husband went to a wedding this past weekend and it went well.  She states that a friend of theirs passed away and that has been very difficult for her husband.  We talked today about the situation with her daughter and she had thoughts of having the attorney send a letter stating that she had accomplished her goal of hurting her and I explained to the patient that it really would not stop the behavior that her daughter is exhibiting that it may just feel the fire for her to do more.  I recommended that she just let it go and have the attorney write a letter if she feels that it is necessary stating that her daughter has violated the agreement.  I recommended that she not allow her feelings to get involved in the letter.  We talked  about how she is doing with her self talk and she seems to be doing a good job managing her negative thoughts.  Interventions: Cognitive Behavioral Therapy and Insight-Oriented  Diagnosis:Major depressive disorder, recurrent, mild (HCC)  Adjustment disorder with mixed anxiety and depressed mood  Plan: Client Abilities/Strengths  Intelligent, ability for insight, good support from husband and church  Client Treatment Preferences  Outpatient individual video visits  Client Statement of Needs  " I need some help managing my depression"  Treatment Level  Outpatient Individual therapy  Symptoms  A family that is not a stable source of positive influence or support, since family members have little or  no contact with each other.: No Description Entered (Status: maintained). Strong emotional response of sadness exhibited when losses are discussed.: No Description Entered (Status: maintained).  Problems Addressed  Grief / Loss Unresolved, Family Conflict  Goals 1. Begin a healthy grieving process around the loss. Objective Verbalize resolution of feelings of guilt and regret associated with the loss. Target Date: 2022-09-26 Frequency: Weekly Progress: 50 Modality: individual Related Interventions 1. Therapy sessions to help patient deal with loss of relationship with grand daughter 2. Reach a level of reduced tension, increased satisfaction, and improved  communication with family and/or other authority figures. Diagnosis F43.23 (Adjustment Disorder, With mixed anxiety and  depressed mood) -  Adjustment Disorder, With Mixed  Anxiety and Depressed Mood  Conditions For Discharge Achievement of treatment goals and objectives  The patient has approved this treatment plan.    Dublin Grayer G Lisabeth Mian,  LCSW

## 2022-05-21 ENCOUNTER — Ambulatory Visit: Payer: Medicare HMO | Admitting: Psychology

## 2022-06-04 ENCOUNTER — Ambulatory Visit (INDEPENDENT_AMBULATORY_CARE_PROVIDER_SITE_OTHER): Payer: Medicare HMO | Admitting: Psychology

## 2022-06-04 DIAGNOSIS — F4323 Adjustment disorder with mixed anxiety and depressed mood: Secondary | ICD-10-CM | POA: Diagnosis not present

## 2022-06-04 DIAGNOSIS — F330 Major depressive disorder, recurrent, mild: Secondary | ICD-10-CM

## 2022-06-04 NOTE — Progress Notes (Signed)
[facility] Counselor/Therapist Progress Note  Patient ID: Kristie Nguyen, MRN: [id number],    Date: 06/04/2022  Time Spent: 60 minutes  Treatment Type: Individual Therapy  Reported Symptoms: sadness, worry  Mental Status Exam: Appearance:  Casual     Behavior: Appropriate  Motor: Normal  Speech/Language:  Normal Rate  Affect: Blunt  Mood: Pleasant  Thought process: normal  Thought content:   WNL  Sensory/Perceptual disturbances:   WNL  Orientation: oriented to person, place, time/date, and situation  Attention: Good  Concentration:  Good  Memory: WNL  Fund of knowledge:  Good  Insight:   Good  Judgment:  Good  Impulse Control: Good   Risk Assessment: Danger to Self:  No Self-injurious Behavior: No Danger to Others: No Duty to Warn:no Physical Aggression / Violence:No  Access to Firearms a concern: No  Gang Involvement:No   Subjective: The patient attended an individual therapy session via video visit today.  The patient gave verbal consent for the session to be on web ex.  Patient was in her home alone and therapist was in the office.  The patient presents as pleasant and cooperative today.  The patient reports that she had surgery a few weeks ago on her knee and she has been recovering nicely.  She states that she was having some difficulty managing her emotions before she had the surgery but she is feels like she is doing better now.  She was able to recognize that some of her issues were related to chronic pain.  She talked about her daughter and said that her daughter was adopted by a friend of hers who is her same age.  The patient reports that it does not bother her at all that this has happened because of the way she thinks now and she feels good about changing her thought patterns.  She realizes that her daughter is dysfunctional and that she is doing things to annoy her and she feels like she can manage her thoughts and feelings related to this.   The patient is doing much better than she has been and she states that it has been life changing to learn that she is controlling her thoughts and can control her feelings by controlling her thoughts.  The patient states that she would like to continue to keep having appointments on a regular basis as the holidays are coming up and she feels like there might be some reason to have a visit and that in between.  The patient is doing very well. Interventions: Cognitive Behavioral Therapy and Insight-Oriented  Diagnosis:No diagnosis found.  Plan: Client Abilities/Strengths  Intelligent, ability for insight, good support from husband and church  Client Treatment Preferences  Outpatient individual video visits  Client Statement of Needs  " I need some help managing my depression"  Treatment Level  Outpatient Individual therapy  Symptoms  A family that is not a stable source of positive influence or support, since family members have little or  no contact with each other.: No Description Entered (Status: maintained). Strong emotional response of sadness exhibited when losses are discussed.: No Description Entered (Status: maintained).  Problems Addressed  Grief / Loss Unresolved, Family Conflict  Goals 1. Begin a healthy grieving process around the loss. Objective Verbalize resolution of feelings of guilt and regret associated with the loss. Target Date: 2022-09-26 Frequency: Weekly Progress: 50 Modality: individual Related Interventions 1. Therapy sessions to help patient deal with loss of relationship with grand daughter 2. Reach  a level of reduced tension, increased satisfaction, and improved  communication with family and/or other authority figures. Diagnosis F43.23 (Adjustment Disorder, With mixed anxiety and  depressed mood) -  Adjustment Disorder, With Mixed  Anxiety and Depressed Mood  Conditions For Discharge Achievement of treatment goals and objectives  The patient has  approved this treatment plan.    Jaymond Waage G Jorel Gravlin, LCSW

## 2022-06-18 ENCOUNTER — Ambulatory Visit (INDEPENDENT_AMBULATORY_CARE_PROVIDER_SITE_OTHER): Payer: Medicare HMO | Admitting: Psychology

## 2022-06-18 DIAGNOSIS — F330 Major depressive disorder, recurrent, mild: Secondary | ICD-10-CM | POA: Diagnosis not present

## 2022-06-18 DIAGNOSIS — F4323 Adjustment disorder with mixed anxiety and depressed mood: Secondary | ICD-10-CM | POA: Diagnosis not present

## 2022-06-19 NOTE — Progress Notes (Signed)
[facility] Counselor/Therapist Progress Note  Patient ID: Kristie Nguyen, MRN: [id number],    Date: 06/18/2022  Time Spent: 60 minutes  Treatment Type: Individual Therapy  Reported Symptoms: sadness, worry  Mental Status Exam: Appearance:  Casual     Behavior: Appropriate  Motor: Normal  Speech/Language:  Normal Rate  Affect: Blunt  Mood: Pleasant  Thought process: normal  Thought content:   WNL  Sensory/Perceptual disturbances:   WNL  Orientation: oriented to person, place, time/date, and situation  Attention: Good  Concentration:  Good  Memory: WNL  Fund of knowledge:  Good  Insight:   Good  Judgment:  Good  Impulse Control: Good   Risk Assessment: Danger to Self:  No Self-injurious Behavior: No Danger to Others: No Duty to Warn:no Physical Aggression / Violence:No  Access to Firearms a concern: No  Gang Involvement:No   Subjective: The patient attended an individual therapy session via video visit today.  The patient gave verbal consent for the session to be on web ex.  Patient was in her home alone and therapist was in the office.  The patient presents as pleasant and cooperative today.  The patient reports that she had about 4 days where she had some difficulty dealing with the situation with her daughter and granddaughter.  The patient did report that she felt like she was much better able since being in therapy to change her thoughts and not stay in despair as long.  We talked about the importance of her catching it when she is starting it and just moving on through it.  The patient seems to be happy with the idea of being able to be in control of her thoughts.  We will continue to use cognitive behavioral therapy and insight oriented therapy to help her work through issues as they arise.  Interventions: Cognitive Behavioral Therapy and Insight-Oriented  Diagnosis:Major depressive disorder, recurrent, mild (HCC)  Adjustment disorder with  mixed anxiety and depressed mood  Plan: Client Abilities/Strengths  Intelligent, ability for insight, good support from husband and church  Client Treatment Preferences  Outpatient individual video visits  Client Statement of Needs  " I need some help managing my depression"  Treatment Level  Outpatient Individual therapy  Symptoms  A family that is not a stable source of positive influence or support, since family members have little or  no contact with each other.: No Description Entered (Status: maintained). Strong emotional response of sadness exhibited when losses are discussed.: No Description Entered (Status: maintained).  Problems Addressed  Grief / Loss Unresolved, Family Conflict  Goals 1. Begin a healthy grieving process around the loss. Objective Verbalize resolution of feelings of guilt and regret associated with the loss. Target Date: 2022-09-26 Frequency: Weekly Progress: 50 Modality: individual Related Interventions 1. Therapy sessions to help patient deal with loss of relationship with grand daughter 2. Reach a level of reduced tension, increased satisfaction, and improved  communication with family and/or other authority figures. Diagnosis F43.23 (Adjustment Disorder, With mixed anxiety and  depressed mood) -  Adjustment Disorder, With Mixed  Anxiety and Depressed Mood  Conditions For Discharge Achievement of treatment goals and objectives  The patient has approved this treatment plan.    Greer Koeppen G Tanis Burnley, LCSW

## 2022-07-04 ENCOUNTER — Ambulatory Visit: Payer: Medicare HMO | Admitting: Psychology

## 2022-07-16 ENCOUNTER — Ambulatory Visit (INDEPENDENT_AMBULATORY_CARE_PROVIDER_SITE_OTHER): Payer: Medicare HMO | Admitting: Psychology

## 2022-07-16 DIAGNOSIS — F330 Major depressive disorder, recurrent, mild: Secondary | ICD-10-CM

## 2022-07-16 DIAGNOSIS — F4323 Adjustment disorder with mixed anxiety and depressed mood: Secondary | ICD-10-CM | POA: Diagnosis not present

## 2022-07-17 NOTE — Progress Notes (Signed)
[facility] Counselor/Therapist Progress Note  Patient ID: SREYA FROIO, MRN: [id number],    Date: 07/16/2022  Time Spent: 60 minutes  Treatment Type: Individual Therapy  Reported Symptoms: sadness, worry  Mental Status Exam: Appearance:  Casual     Behavior: Appropriate  Motor: Normal  Speech/Language:  Normal Rate  Affect: Blunt  Mood: Pleasant  Thought process: normal  Thought content:   WNL  Sensory/Perceptual disturbances:   WNL  Orientation: oriented to person, place, time/date, and situation  Attention: Good  Concentration:  Good  Memory: WNL  Fund of knowledge:  Good  Insight:   Good  Judgment:  Good  Impulse Control: Good   Risk Assessment: Danger to Self:  No Self-injurious Behavior: No Danger to Others: No Duty to Warn:no Physical Aggression / Violence:No  Access to Firearms a concern: No  Gang Involvement:No   Subjective: The patient attended an individual therapy session via video visit today.  The patient gave verbal consent for the session to be on web ex.  Patient was in her home alone and therapist was in the office.  The patient presents as pleasant and cooperative today.  The patient reports that the thing that she wants to talk about today is that she does not feel settled.  She states that she and her husband had talked to some other people that they have an investment with about renting a property [place] toward [facility] and apparently the partners do not want to rent to them.  She talked about possibly wanting to get an RV to go around in, but she is not sure what this unsettledness is about.  We talked about the possibility that she had it in her mind that she was going to be moving down towards [place] to take care of her grandchild and that is what her purpose was about.  We talked about that changing and causing her some distress and that this may be part of the reason she cannot get comfortable where she is living.  We talked  about the possibility of her just needing to be accepting of where she is right now and going ahead and getting an RV if that is really what she wants to do.  She feels that this would be fine for both she and her husband and she is going to have a conversation with him about the circumstance.  I told her that maybe her purpose now was just to have fun and enjoy herself.  We talked about her going and trying different places out before she makes a decision and they can decide where they want to live.  The patient felt that this was a good plan.  Interventions: Cognitive Behavioral Therapy and Insight-Oriented  Diagnosis:Major depressive disorder, recurrent, mild (HCC)  Adjustment disorder with mixed anxiety and depressed mood  Plan: Client Abilities/Strengths  Intelligent, ability for insight, good support from husband and church  Client Treatment Preferences  Outpatient individual video visits  Client Statement of Needs  " I need some help managing my depression"  Treatment Level  Outpatient Individual therapy  Symptoms  A family that is not a stable source of positive influence or support, since family members have little or  no contact with each other.: No Description Entered (Status: maintained). Strong emotional response of sadness exhibited when losses are discussed.: No Description Entered (Status: maintained).  Problems Addressed  Grief / Loss Unresolved, Family Conflict  Goals 1. Begin a healthy grieving process around the loss. Objective  Verbalize resolution of feelings of guilt and regret associated with the loss. Target Date: 2022-09-26 Frequency: Weekly Progress: 50 Modality: individual Related Interventions 1. Therapy sessions to help patient deal with loss of relationship with grand daughter 2. Reach a level of reduced tension, increased satisfaction, and improved  communication with family and/or other authority figures. Diagnosis F43.23 (Adjustment Disorder, With  mixed anxiety and  depressed mood) -  Adjustment Disorder, With Mixed  Anxiety and Depressed Mood  Conditions For Discharge Achievement of treatment goals and objectives  The patient has approved this treatment plan.    Amanda Pote G Chana Lindstrom, LCSW

## 2022-07-30 ENCOUNTER — Ambulatory Visit (INDEPENDENT_AMBULATORY_CARE_PROVIDER_SITE_OTHER): Payer: Medicare HMO | Admitting: Psychology

## 2022-07-30 DIAGNOSIS — F330 Major depressive disorder, recurrent, mild: Secondary | ICD-10-CM | POA: Diagnosis not present

## 2022-07-30 DIAGNOSIS — F4323 Adjustment disorder with mixed anxiety and depressed mood: Secondary | ICD-10-CM | POA: Diagnosis not present

## 2022-07-30 NOTE — Progress Notes (Signed)
[facility] Counselor/Therapist Progress Note  Patient ID: MONIQUA ENGEBRETSEN, MRN: [id number],    Date: 07/30/2022  Time Spent: 60 minutes  Treatment Type: Individual Therapy  Reported Symptoms: sadness, worry  Mental Status Exam: Appearance:  Casual     Behavior: Appropriate  Motor: Normal  Speech/Language:  Normal Rate  Affect: Blunt  Mood: Pleasant  Thought process: normal  Thought content:   WNL  Sensory/Perceptual disturbances:   WNL  Orientation: oriented to person, place, time/date, and situation  Attention: Good  Concentration:  Good  Memory: WNL  Fund of knowledge:  Good  Insight:   Good  Judgment:  Good  Impulse Control: Good   Risk Assessment: Danger to Self:  No Self-injurious Behavior: No Danger to Others: No Duty to Warn:no Physical Aggression / Violence:No  Access to Firearms a concern: No  Gang Involvement:No   Subjective: The patient attended an individual therapy session via video visit today.  The patient gave verbal consent for the session to be on web ex.  Patient was in her home alone and therapist was in the office.  The patient presents as pleasant and cooperative today.  The patient reports that she and her husband went out after our last session and bought an RV .  The patient reports that she is excited about this and has several trips planned already with her aunt.  She states that she feels a little more settled about things because she has made some changes.  She states that she and her husband are getting the house ready to sell.  We talked about some of the things that they planned to do moving forward and she feels like at least they have a plan that they are putting in place.  I think the patient does not feel like the area is her home because she had moved down to take care of her granddaughter and that did not work out.  The patient seems to be doing well and did not have any issues that she really wanted to discuss and work  through today but she wanted to talk about her progress. Interventions: Cognitive Behavioral Therapy and Insight-Oriented  Diagnosis:Major depressive disorder, recurrent, mild (HCC)  Adjustment disorder with mixed anxiety and depressed mood  Plan: Client Abilities/Strengths  Intelligent, ability for insight, good support from husband and church  Client Treatment Preferences  Outpatient individual video visits  Client Statement of Needs  " I need some help managing my depression"  Treatment Level  Outpatient Individual therapy  Symptoms  A family that is not a stable source of positive influence or support, since family members have little or  no contact with each other.: No Description Entered (Status: maintained). Strong emotional response of sadness exhibited when losses are discussed.: No Description Entered (Status: maintained).  Problems Addressed  Grief / Loss Unresolved, Family Conflict  Goals 1. Begin a healthy grieving process around the loss. Objective Verbalize resolution of feelings of guilt and regret associated with the loss. Target Date: 2022-09-26 Frequency: Weekly Progress: 50 Modality: individual Related Interventions 1. Therapy sessions to help patient deal with loss of relationship with grand daughter 2. Reach a level of reduced tension, increased satisfaction, and improved  communication with family and/or other authority figures. Diagnosis F43.23 (Adjustment Disorder, With mixed anxiety and  depressed mood) -  Adjustment Disorder, With Mixed  Anxiety and Depressed Mood  Conditions For Discharge Achievement of treatment goals and objectives  The patient has approved this treatment plan.  Elspeth Blucher G Naiomy Watters, LCSW

## 2022-08-10 LAB — EXTERNAL GENERIC LAB PROCEDURE: COLOGUARD: POSITIVE — AB

## 2022-08-13 ENCOUNTER — Ambulatory Visit (INDEPENDENT_AMBULATORY_CARE_PROVIDER_SITE_OTHER): Payer: Medicare HMO | Admitting: Psychology

## 2022-08-13 DIAGNOSIS — F330 Major depressive disorder, recurrent, mild: Secondary | ICD-10-CM

## 2022-08-13 DIAGNOSIS — F4323 Adjustment disorder with mixed anxiety and depressed mood: Secondary | ICD-10-CM

## 2022-08-14 NOTE — Progress Notes (Signed)
[facility] Counselor/Therapist Progress Note  Patient ID: Kristie Nguyen, MRN: [id number],    Date: 08/13/2022  Time Spent: 60 minutes  Treatment Type: Individual Therapy  Reported Symptoms: sadness, worry  Mental Status Exam: Appearance:  Casual     Behavior: Appropriate  Motor: Normal  Speech/Language:  Normal Rate  Affect: Blunt  Mood: Pleasant  Thought process: normal  Thought content:   WNL  Sensory/Perceptual disturbances:   WNL  Orientation: oriented to person, place, time/date, and situation  Attention: Good  Concentration:  Good  Memory: WNL  Fund of knowledge:  Good  Insight:   Good  Judgment:  Good  Impulse Control: Good   Risk Assessment: Danger to Self:  No Self-injurious Behavior: No Danger to Others: No Duty to Warn:no Physical Aggression / Violence:No  Access to Firearms a concern: No  Gang Involvement:No   Subjective: The patient attended an individual therapy session via video visit today.  The patient gave verbal consent for the session to be on web ex.  Patient was in her home alone and therapist was in the office.  The patient presents as pleasant and cooperative today.   The patient reports that things are going well.  She states that her husband is doing some work around the house to get the house ready to sell.  They are going to go in the RV in March to [place].  The patient states that she is reading a new book book and she feels real positive about it.  The name of the book is 5 types of people who will ruin your life.  We talked about what she is learning from the book.  She feels very happy that she is able to detach from her feelings about her daughter and granddaughter.  The patient is doing much better with this issue.  Interventions: Cognitive Behavioral Therapy and Insight-Oriented  Diagnosis:Major depressive disorder, recurrent, mild (HCC)  Adjustment disorder with mixed anxiety and depressed mood  Plan: Client  Abilities/Strengths  Intelligent, ability for insight, good support from husband and church  Client Treatment Preferences  Outpatient individual video visits  Client Statement of Needs  " I need some help managing my depression"  Treatment Level  Outpatient Individual therapy  Symptoms  A family that is not a stable source of positive influence or support, since family members have little or  no contact with each other.: No Description Entered (Status: maintained). Strong emotional response of sadness exhibited when losses are discussed.: No Description Entered (Status: maintained).  Problems Addressed  Grief / Loss Unresolved, Family Conflict  Goals 1. Begin a healthy grieving process around the loss. Objective Verbalize resolution of feelings of guilt and regret associated with the loss. Target Date: 2022-09-26 Frequency: Weekly Progress: 50 Modality: individual Related Interventions 1. Therapy sessions to help patient deal with loss of relationship with grand daughter 2. Reach a level of reduced tension, increased satisfaction, and improved  communication with family and/or other authority figures. Diagnosis F43.23 (Adjustment Disorder, With mixed anxiety and  depressed mood) -  Adjustment Disorder, With Mixed  Anxiety and Depressed Mood  Conditions For Discharge Achievement of treatment goals and objectives  The patient has approved this treatment plan.    [place] Souter G Sir Mallis, LCSW

## 2022-08-27 ENCOUNTER — Ambulatory Visit (INDEPENDENT_AMBULATORY_CARE_PROVIDER_SITE_OTHER): Payer: Medicare HMO | Admitting: Psychology

## 2022-08-27 DIAGNOSIS — F330 Major depressive disorder, recurrent, mild: Secondary | ICD-10-CM | POA: Diagnosis not present

## 2022-08-27 DIAGNOSIS — F4323 Adjustment disorder with mixed anxiety and depressed mood: Secondary | ICD-10-CM

## 2022-08-28 NOTE — Progress Notes (Signed)
[place] Counselor/Therapist Progress Note  Patient ID: Kristie Nguyen, MRN: [id number],    Date: 08/27/2022  Time Spent: 60 minutes  Treatment Type: Individual Therapy  Reported Symptoms: sadness, worry  Mental Status Exam: Appearance:  Casual     Behavior: Appropriate  Motor: Normal  Speech/Language:  Normal Rate  Affect: Blunt  Mood: Pleasant  Thought process: normal  Thought content:   WNL  Sensory/Perceptual disturbances:   WNL  Orientation: oriented to person, place, time/date, and situation  Attention: Good  Concentration:  Good  Memory: WNL  Fund of knowledge:  Good  Insight:   Good  Judgment:  Good  Impulse Control: Good   Risk Assessment: Danger to Self:  No Self-injurious Behavior: No Danger to Others: No Duty to Warn:no Physical Aggression / Violence:No  Access to Firearms a concern: No  Gang Involvement:No   Subjective: The patient attended an individual therapy session via video visit today.  The patient gave verbal consent for the session to be on web ex.  Patient was in her home alone and therapist was in the office.  The patient presents as pleasant and cooperative today.   The patient reports that she enjoyed her trip to [place] recently.  She says that things went well and that she enjoyed her visit with her aunt.  We talked about the next steps for she and her husband and it seems that she is interested in maybe selling her house and just moving around in the RV.  She did talk about her son and daughter-in-law coming down and staying in the RV at her house and had some angst about that.  We talked about how it might be a better situation for her because she would have them in the RV in have free reign of the house.  We talked about the need for her to continue to set limits with family members.  Interventions: Cognitive Behavioral Therapy and Insight-Oriented  Diagnosis:Major depressive disorder, recurrent, mild (HCC)  Adjustment  disorder with mixed anxiety and depressed mood  Plan: Client Abilities/Strengths  Intelligent, ability for insight, good support from husband and church  Client Treatment Preferences  Outpatient individual video visits  Client Statement of Needs  " I need some help managing my depression"  Treatment Level  Outpatient Individual therapy  Symptoms  A family that is not a stable source of positive influence or support, since family members have little or  no contact with each other.: No Description Entered (Status: maintained). Strong emotional response of sadness exhibited when losses are discussed.: No Description Entered (Status: maintained).  Problems Addressed  Grief / Loss Unresolved, Family Conflict  Goals 1. Begin a healthy grieving process around the loss. Objective Verbalize resolution of feelings of guilt and regret associated with the loss. Target Date: 2022-09-26 Frequency: Weekly Progress: 50 Modality: individual Related Interventions 1. Therapy sessions to help patient deal with loss of relationship with grand daughter 2. Reach a level of reduced tension, increased satisfaction, and improved  communication with family and/or other authority figures. Diagnosis F43.23 (Adjustment Disorder, With mixed anxiety and  depressed mood) -  Adjustment Disorder, With Mixed  Anxiety and Depressed Mood  Conditions For Discharge Achievement of treatment goals and objectives  The patient has approved this treatment plan.    Arlisha Patalano G Veer Elamin, LCSW

## 2022-09-10 ENCOUNTER — Ambulatory Visit (INDEPENDENT_AMBULATORY_CARE_PROVIDER_SITE_OTHER): Payer: Medicare HMO | Admitting: Psychology

## 2022-09-10 DIAGNOSIS — F4323 Adjustment disorder with mixed anxiety and depressed mood: Secondary | ICD-10-CM | POA: Diagnosis not present

## 2022-09-10 DIAGNOSIS — F330 Major depressive disorder, recurrent, mild: Secondary | ICD-10-CM | POA: Diagnosis not present

## 2022-09-11 NOTE — Progress Notes (Signed)
[facility] Counselor/Therapist Progress Note  Patient ID: Kristie Nguyen, MRN: [id number],    Date: 09/10/2022  Time Spent: 45 minutes  Treatment Type: Individual Therapy  Reported Symptoms: sadness, worry  Mental Status Exam: Appearance:  Casual     Behavior: Appropriate  Motor: Normal  Speech/Language:  Normal Rate  Affect: Blunt  Mood: Pleasant  Thought process: normal  Thought content:   WNL  Sensory/Perceptual disturbances:   WNL  Orientation: oriented to person, place, time/date, and situation  Attention: Good  Concentration:  Good  Memory: WNL  Fund of knowledge:  Good  Insight:   Good  Judgment:  Good  Impulse Control: Good   Risk Assessment: Danger to Self:  No Self-injurious Behavior: No Danger to Others: No Duty to Warn:no Physical Aggression / Violence:No  Access to Firearms a concern: No  Gang Involvement:No   Subjective: The patient attended an individual therapy session via video visit today.  The patient gave verbal consent for the session to be on web ex.  Patient was in her home alone and therapist was in the office.  The patient presents as pleasant and cooperative today.   The patient reports that she has had COVID for the last few weeks.  She talked about how she got it and her husband also had it.  The patient seems to be doing relatively well and she is excited about going on her RV trips coming up.  The patient seems to be doing well with the situation with her daughter but anticipates that her daughter will probably act up again in the next few months.  We talked about how she feels about selling her home in also about moving forward with the new life that she and her husband have planned.  Interventions: Cognitive Behavioral Therapy and Insight-Oriented  Diagnosis:Major depressive disorder, recurrent, mild (HCC)  Adjustment disorder with mixed anxiety and depressed mood  Plan: Client Abilities/Strengths  Intelligent,  ability for insight, good support from husband and church  Client Treatment Preferences  Outpatient individual video visits  Client Statement of Needs  " I need some help managing my depression"  Treatment Level  Outpatient Individual therapy  Symptoms  A family that is not a stable source of positive influence or support, since family members have little or  no contact with each other.: No Description Entered (Status: maintained). Strong emotional response of sadness exhibited when losses are discussed.: No Description Entered (Status: maintained).  Problems Addressed  Grief / Loss Unresolved, Family Conflict  Goals 1. Begin a healthy grieving process around the loss. Objective Verbalize resolution of feelings of guilt and regret associated with the loss. Target Date: 2022-09-26 Frequency: Weekly Progress: 50 Modality: individual Related Interventions 1. Therapy sessions to help patient deal with loss of relationship with grand daughter 2. Reach a level of reduced tension, increased satisfaction, and improved  communication with family and/or other authority figures. Diagnosis F43.23 (Adjustment Disorder, With mixed anxiety and  depressed mood) -  Adjustment Disorder, With Mixed  Anxiety and Depressed Mood  Conditions For Discharge Achievement of treatment goals and objectives  The patient has approved this treatment plan.    Lenice Koper G Skie Vitrano, LCSW

## 2022-09-24 ENCOUNTER — Ambulatory Visit (INDEPENDENT_AMBULATORY_CARE_PROVIDER_SITE_OTHER): Payer: Medicare HMO | Admitting: Psychology

## 2022-09-24 DIAGNOSIS — F330 Major depressive disorder, recurrent, mild: Secondary | ICD-10-CM | POA: Diagnosis not present

## 2022-09-24 DIAGNOSIS — F4323 Adjustment disorder with mixed anxiety and depressed mood: Secondary | ICD-10-CM | POA: Diagnosis not present
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
## 2022-10-01 ENCOUNTER — Ambulatory Visit: Payer: Medicare HMO | Admitting: Psychology

## 2022-10-09 ENCOUNTER — Ambulatory Visit: Payer: Medicare HMO | Admitting: Psychology

## 2022-10-22 ENCOUNTER — Ambulatory Visit: Payer: Medicare HMO | Admitting: Psychology

## 2022-10-22 DIAGNOSIS — F330 Major depressive disorder, recurrent, mild: Secondary | ICD-10-CM

## 2022-10-22 DIAGNOSIS — F4323 Adjustment disorder with mixed anxiety and depressed mood: Secondary | ICD-10-CM

## 2022-10-22 NOTE — Progress Notes (Signed)
[facility] Counselor/Therapist Progress Note  Patient ID: Kristie Nguyen, MRN: [id number],    Date: 10/22/2022  Time Spent: 55 minutes  Treatment Type: Individual Therapy  Reported Symptoms: sadness, worry  Mental Status Exam: Appearance:  Casual     Behavior: Appropriate  Motor: Normal  Speech/Language:  Normal Rate  Affect: Blunt  Mood: Pleasant  Thought process: normal  Thought content:   WNL  Sensory/Perceptual disturbances:   WNL  Orientation: oriented to person, place, time/date, and situation  Attention: Good  Concentration:  Good  Memory: WNL  Fund of knowledge:  Good  Insight:   Good  Judgment:  Good  Impulse Control: Good   Risk Assessment: Danger to Self:  No Self-injurious Behavior: No Danger to Others: No Duty to Warn:no Physical Aggression / Violence:No  Access to Firearms a concern: No  Gang Involvement:No   Subjective: The patient attended an individual therapy session via video visit today.  The patient gave verbal consent for the session to be on web ex.  Patient was in her home alone and therapist was in the office.  The patient presents as pleasant and cooperative today.   The patient talked today about them selling their house and going RVing.  The patient seems very excited about doing this.  She talked a little about her husband being stressed out about it.  We talked today about her son and some of his issues with his wife.  And I educated her on some human behavior and codependency issues.  In addition we talked about her not knowing what to do with her daughter's pictures from childhood.  I explained to her that she may want to digitize them for her granddaughter.  I explained that I thought her granddaughter would likely want to know what happened from her perspective with her mom.  We used problem solving and cognitive behavioral therapy, as well as, psychoeducation.  Interventions: Cognitive Behavioral Therapy and  Insight-Oriented  Diagnosis:Major depressive disorder, recurrent, mild  Adjustment disorder with mixed anxiety and depressed mood  Plan: Client Abilities/Strengths  Intelligent, ability for insight, good support from husband and church  Client Treatment Preferences  Outpatient individual video visits  Client Statement of Needs  " I need some help managing my depression"  Treatment Level  Outpatient Individual therapy  Symptoms  A family that is not a stable source of positive influence or support, since family members have little or  no contact with each other.: No Description Entered (Status: maintained). Strong emotional response of sadness exhibited when losses are discussed.: No Description Entered (Status: maintained).  Problems Addressed  Grief / Loss Unresolved, Family Conflict  Goals 1. Begin a healthy grieving process around the loss. Objective Verbalize resolution of feelings of guilt and regret associated with the loss. Target Date: 2023-09-26 Frequency: Weekly Progress: 50 Modality: individual Related Interventions 1. Therapy sessions to help patient deal with loss of relationship with grand daughter 2. Reach a level of reduced tension, increased satisfaction, and improved  communication with family and/or other authority figures. Diagnosis F43.23 (Adjustment Disorder, With mixed anxiety and  depressed mood) -  Adjustment Disorder, With Mixed  Anxiety and Depressed Mood  Conditions For Discharge Achievement of treatment goals and objectives  The patient has approved this treatment plan.    Jorita Bohanon G Regine Christian, LCSW

## 2022-11-05 ENCOUNTER — Ambulatory Visit (INDEPENDENT_AMBULATORY_CARE_PROVIDER_SITE_OTHER): Payer: Medicare HMO | Admitting: Psychology

## 2022-11-05 DIAGNOSIS — F4323 Adjustment disorder with mixed anxiety and depressed mood: Secondary | ICD-10-CM | POA: Diagnosis not present

## 2022-11-05 NOTE — Progress Notes (Signed)
                Wealthy Danielski G Kaynan Klonowski, LCSW

## 2022-11-19 ENCOUNTER — Ambulatory Visit (INDEPENDENT_AMBULATORY_CARE_PROVIDER_SITE_OTHER): Payer: Medicare HMO | Admitting: Psychology

## 2022-11-19 DIAGNOSIS — F330 Major depressive disorder, recurrent, mild: Secondary | ICD-10-CM

## 2022-11-19 DIAGNOSIS — F4323 Adjustment disorder with mixed anxiety and depressed mood: Secondary | ICD-10-CM | POA: Diagnosis not present

## 2022-11-19 NOTE — Progress Notes (Signed)
                Lesley Galentine G Beyonce Sawatzky, LCSW 

## 2022-12-04 ENCOUNTER — Ambulatory Visit (INDEPENDENT_AMBULATORY_CARE_PROVIDER_SITE_OTHER): Payer: Medicare HMO | Admitting: Psychology

## 2022-12-04 DIAGNOSIS — F4323 Adjustment disorder with mixed anxiety and depressed mood: Secondary | ICD-10-CM

## 2022-12-04 DIAGNOSIS — F330 Major depressive disorder, recurrent, mild: Secondary | ICD-10-CM | POA: Diagnosis not present

## 2022-12-04 NOTE — Progress Notes (Signed)
                Brynnan Rodenbaugh G Jamas Jaquay, LCSW

## 2022-12-18 ENCOUNTER — Ambulatory Visit: Payer: Medicare HMO | Admitting: Psychology

## 2023-01-10 ENCOUNTER — Ambulatory Visit (INDEPENDENT_AMBULATORY_CARE_PROVIDER_SITE_OTHER): Payer: Medicare HMO | Admitting: Psychology

## 2023-01-10 DIAGNOSIS — F4323 Adjustment disorder with mixed anxiety and depressed mood: Secondary | ICD-10-CM | POA: Diagnosis not present

## 2023-01-10 DIAGNOSIS — F330 Major depressive disorder, recurrent, mild: Secondary | ICD-10-CM | POA: Diagnosis not present

## 2023-01-10 NOTE — Progress Notes (Signed)
[facility] Counselor/Therapist Progress Note  Patient ID: Kristie Nguyen, MRN: [id number],    Date: 01/10/2023  Time Spent: 60 minutes  Time in:  1:00  Time out:  2:01  Treatment Type: Individual Therapy  Reported Symptoms: sadness, worry  Mental Status Exam: Appearance:  Casual     Behavior: Appropriate  Motor: Normal  Speech/Language:  Normal Rate  Affect: Blunt  Mood: Pleasant  Thought process: normal  Thought content:   WNL  Sensory/Perceptual disturbances:   WNL  Orientation: oriented to person, place, time/date, and situation  Attention: Good  Concentration:  Good  Memory: WNL  Fund of knowledge:  Good  Insight:   Good  Judgment:  Good  Impulse Control: Good   Risk Assessment: Danger to Self:  No Self-injurious Behavior: No Danger to Others: No Duty to Warn:no Physical Aggression / Violence:No  Access to Firearms a concern: No  Gang Involvement:No   Subjective: The patient attended an individual therapy session via video visit today.  The patient gave verbal consent for the session to be on Caregility and she is aware of the limitations of telehealth.  Patient was in her home alone and therapist was in the office.  The patient reports that her MS was flaring up pretty significantly during the move.  She states that she is doing better now and they are in their RV.  She talked today about her son and what happened with her husband and him planning to go keep the grandchildren but ended up having to cancel.  They canceled because her son changed the plan on them.  We talked about boundaries and how relationships are supposed to be equally giving and receiving and that what has happened with them is that they have expectations that they are children or other people are going to think like they think and thus they will be disappointed.  We talked about how to set limits and boundaries more based on the ability of the other person to interact the way  you need them to interact.  Used cognitive behavioral therapy and limit setting during this session. Interventions: Cognitive Behavioral Therapy and Insight-Oriented  Diagnosis:Adjustment disorder with mixed anxiety and depressed mood  Major depressive disorder, recurrent, mild (HCC)  Plan: Client Abilities/Strengths  Intelligent, ability for insight, good support from husband and church  Client Treatment Preferences  Outpatient individual video visits  Client Statement of Needs  " I need some help managing my depression"  Treatment Level  Outpatient Individual therapy  Symptoms  A family that is not a stable source of positive influence or support, since family members have little or  no contact with each other.: No Description Entered (Status: maintained). Strong emotional response of sadness exhibited when losses are discussed.: No Description Entered (Status: maintained).  Problems Addressed  Grief / Loss Unresolved, Family Conflict  Goals 1. Begin a healthy grieving process around the loss. Objective Verbalize resolution of feelings of guilt and regret associated with the loss. Target Date: 2023-09-26 Frequency: Weekly Progress: 50 Modality: individual Related Interventions 1. Therapy sessions to help patient deal with loss of relationship with grand daughter 2. Reach a level of reduced tension, increased satisfaction, and improved  communication with family and/or other authority figures. Diagnosis F43.23 (Adjustment Disorder, With mixed anxiety and  depressed mood) -  Adjustment Disorder, With Mixed  Anxiety and Depressed Mood  Conditions For Discharge Achievement of treatment goals and objectives  The patient has approved this treatment plan.  Cailan Antonucci G Laiza Veenstra, LCSW

## 2023-01-21 ENCOUNTER — Ambulatory Visit: Payer: Medicare HMO | Admitting: Psychology

## 2023-01-21 DIAGNOSIS — F330 Major depressive disorder, recurrent, mild: Secondary | ICD-10-CM | POA: Diagnosis not present

## 2023-01-21 DIAGNOSIS — F4323 Adjustment disorder with mixed anxiety and depressed mood: Secondary | ICD-10-CM | POA: Diagnosis not present
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
## 2023-02-04 ENCOUNTER — Ambulatory Visit: Payer: Medicare HMO | Admitting: Psychology

## 2023-02-13 ENCOUNTER — Ambulatory Visit: Payer: Medicare HMO | Admitting: Psychology

## 2023-02-13 DIAGNOSIS — F4323 Adjustment disorder with mixed anxiety and depressed mood: Secondary | ICD-10-CM | POA: Diagnosis not present

## 2023-02-13 DIAGNOSIS — F330 Major depressive disorder, recurrent, mild: Secondary | ICD-10-CM | POA: Diagnosis not present

## 2023-02-13 NOTE — Progress Notes (Signed)
[facility] Counselor/Therapist Progress Note  Patient ID: ZAHRIAH ROES, MRN: [id number],    Date: 02/13/2023  Time Spent: 45 minutes  Time in:  2:00  Time out:  2:45  Treatment Type: Individual Therapy  Reported Symptoms: sadness, worry  Mental Status Exam: Appearance:  Casual     Behavior: Appropriate  Motor: Normal  Speech/Language:  Normal Rate  Affect: Blunt  Mood: Pleasant  Thought process: normal  Thought content:   WNL  Sensory/Perceptual disturbances:   WNL  Orientation: oriented to person, place, time/date, and situation  Attention: Good  Concentration:  Good  Memory: WNL  Fund of knowledge:  Good  Insight:   Good  Judgment:  Good  Impulse Control: Good   Risk Assessment: Danger to Self:  No Self-injurious Behavior: No Danger to Others: No Duty to Warn:no Physical Aggression / Violence:No  Access to Firearms a concern: No  Gang Involvement:No   Subjective: The patient attended an individual therapy session via video visit today.  The patient gave verbal consent for the session to be on Caregility and she is aware of the limitations of telehealth.  Patient was in her home alone and therapist was in the office.  We ended up having to switch to the telephone because the patient's Internet was not very good and we could not keep the video going.  Today we talked about how things are going with her and her husband living in their RV and she reports that she is doing great with that.  She does report that she went up to [place] and spent some time with her aunt and reset and that things have been much better since she returned.  The patient states that she does not feel nearly as stressed as she did when she was living in the house and she does not seem to be as worried about her children and grandchildren now that she is living on the road.  We talked about how she and her husband are doing and her husband seems to be managing things well too.  The  patient stated that she would like to try to continue to check in about once a month. Interventions: Cognitive Behavioral Therapy and Insight-Oriented  Diagnosis:Adjustment disorder with mixed anxiety and depressed mood  Major depressive disorder, recurrent, mild (HCC)  Plan: Client Abilities/Strengths  Intelligent, ability for insight, good support from husband and church  Client Treatment Preferences  Outpatient individual video visits  Client Statement of Needs  " I need some help managing my depression"  Treatment Level  Outpatient Individual therapy  Symptoms  A family that is not a stable source of positive influence or support, since family members have little or  no contact with each other.: No Description Entered (Status: maintained). Strong emotional response of sadness exhibited when losses are discussed.: No Description Entered (Status: maintained).  Problems Addressed  Grief / Loss Unresolved, Family Conflict  Goals 1. Begin a healthy grieving process around the loss. Objective Verbalize resolution of feelings of guilt and regret associated with the loss. Target Date: 2023-09-26 Frequency: Weekly Progress: 50 Modality: individual Related Interventions 1. Therapy sessions to help patient deal with loss of relationship with grand daughter 2. Reach a level of reduced tension, increased satisfaction, and improved  communication with family and/or other authority figures. Diagnosis F43.23 (Adjustment Disorder, With mixed anxiety and  depressed mood) -  Adjustment Disorder, With Mixed  Anxiety and Depressed Mood  Conditions For Discharge Achievement of treatment goals and objectives  The patient has approved this treatment plan.    Jayvien Rowlette G Mearle Drew, LCSW

## 2023-03-04 ENCOUNTER — Ambulatory Visit (INDEPENDENT_AMBULATORY_CARE_PROVIDER_SITE_OTHER): Payer: Medicare HMO | Admitting: Psychology

## 2023-03-04 DIAGNOSIS — F330 Major depressive disorder, recurrent, mild: Secondary | ICD-10-CM

## 2023-03-04 DIAGNOSIS — F4323 Adjustment disorder with mixed anxiety and depressed mood: Secondary | ICD-10-CM

## 2023-03-04 NOTE — Progress Notes (Signed)
[facility] Counselor/Therapist Progress Note  Patient ID: Kristie Nguyen, MRN: [id number],    Date: 03/04/2023  Time Spent: 45 minutes  Time in:  2:00  Time out:  2:45  Treatment Type: Individual Therapy  Reported Symptoms: sadness, worry  Mental Status Exam: Appearance:  Casual     Behavior: Appropriate  Motor: Normal  Speech/Language:  Normal Rate  Affect: Blunt  Mood: Pleasant  Thought process: normal  Thought content:   WNL  Sensory/Perceptual disturbances:   WNL  Orientation: oriented to person, place, time/date, and situation  Attention: Good  Concentration:  Good  Memory: WNL  Fund of knowledge:  Good  Insight:   Good  Judgment:  Good  Impulse Control: Good   Risk Assessment: Danger to Self:  No Self-injurious Behavior: No Danger to Others: No Duty to Warn:no Physical Aggression / Violence:No  Access to Firearms a concern: No  Gang Involvement:No   Subjective: The patient attended an individual therapy session via video visit today.  The patient gave verbal consent for the session to be on Caregility and she is aware of the limitations of telehealth.  Patient was in her home alone and therapist was in the office.  We ended up having to switch to the telephone because the patient's Internet was not very good and we could not keep the video going.  The patient continues to have some difficulty with Internet because [place] an [place].  Today we talked about what she has been doing and they apparently got themselves a different RV to live then.  They added some Square footage.  The patient reports that they have been a little more stressed because of having to go deal with the move and buying a new RV.  She states that she is still happy with the decision that they have made to live in an RV right now.  She talked a little more today about her issues with her children and I did some reframing for her.  I think her expectations of herself are high  and that she feels that she needs to interact with them and be helpful.  Her expectations of them are high as well and that she thinks they think like she thinks.  We talked about this and I explained to her that they have a different perspective than she does and that it is perfectly okay for her to set limits and boundaries and if they interact they interact and if they do not they do not.  I explained that she has to manage her expectations of them and herself. Interventions: Cognitive Behavioral Therapy and Insight-Oriented  Diagnosis:Adjustment disorder with mixed anxiety and depressed mood  Major depressive disorder, recurrent, mild (HCC)  Plan: Client Abilities/Strengths  Intelligent, ability for insight, good support from husband and church  Client Treatment Preferences  Outpatient individual video visits  Client Statement of Needs  " I need some help managing my depression"  Treatment Level  Outpatient Individual therapy  Symptoms  A family that is not a stable source of positive influence or support, since family members have little or  no contact with each other.: No Description Entered (Status: maintained). Strong emotional response of sadness exhibited when losses are discussed.: No Description Entered (Status: maintained).  Problems Addressed  Grief / Loss Unresolved, Family Conflict  Goals 1. Begin a healthy grieving process around the loss. Objective Verbalize resolution of feelings of guilt and regret associated with the loss. Target Date:  2023-09-26 Frequency: Weekly Progress: 50 Modality: individual Related Interventions 1. Therapy sessions to help patient deal with loss of relationship with grand daughter 2. Reach a level of reduced tension, increased satisfaction, and improved  communication with family and/or other authority figures. Diagnosis F43.23 (Adjustment Disorder, With mixed anxiety and  depressed mood) -  Adjustment Disorder, With Mixed  Anxiety and  Depressed Mood  Conditions For Discharge Achievement of treatment goals and objectives  The patient has approved this treatment plan.    Sharlie Shreffler G Akyia Borelli, LCSW

## 2023-04-09 ENCOUNTER — Ambulatory Visit: Payer: Medicare HMO | Admitting: Psychology

## 2023-04-09 DIAGNOSIS — F4323 Adjustment disorder with mixed anxiety and depressed mood: Secondary | ICD-10-CM | POA: Diagnosis not present

## 2023-04-09 DIAGNOSIS — F330 Major depressive disorder, recurrent, mild: Secondary | ICD-10-CM | POA: Diagnosis not present
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
## 2023-05-07 ENCOUNTER — Ambulatory Visit: Payer: Medicare HMO | Admitting: Psychology

## 2023-05-07 DIAGNOSIS — F4323 Adjustment disorder with mixed anxiety and depressed mood: Secondary | ICD-10-CM

## 2023-05-07 DIAGNOSIS — F330 Major depressive disorder, recurrent, mild: Secondary | ICD-10-CM | POA: Diagnosis not present

## 2023-05-07 NOTE — Progress Notes (Unsigned)
                Sansa Alkema G Mckaylin Bastien, LCSW

## 2023-06-18 ENCOUNTER — Ambulatory Visit: Payer: Medicare HMO | Admitting: Psychology

## 2023-06-18 DIAGNOSIS — F330 Major depressive disorder, recurrent, mild: Secondary | ICD-10-CM

## 2023-06-18 DIAGNOSIS — F4323 Adjustment disorder with mixed anxiety and depressed mood: Secondary | ICD-10-CM

## 2023-06-18 NOTE — Progress Notes (Unsigned)
                Fonnie Crookshanks G Janney Priego, LCSW

## 2023-07-16 ENCOUNTER — Ambulatory Visit: Payer: Medicare HMO | Admitting: Psychology

## 2023-09-11 ENCOUNTER — Ambulatory Visit: Payer: Medicare HMO | Admitting: Psychology

## 2023-09-11 DIAGNOSIS — F4323 Adjustment disorder with mixed anxiety and depressed mood: Secondary | ICD-10-CM

## 2023-09-11 DIAGNOSIS — F330 Major depressive disorder, recurrent, mild: Secondary | ICD-10-CM

## 2023-09-11 NOTE — Progress Notes (Unsigned)
 [facility] Counselor/Therapist Progress Note  Patient ID: Kristie Nguyen, MRN: [id number],    Date: 09/11/2023  Time Spent: 58 minutes  Time in:  12:05  Time out:  12:59  Treatment Type: Individual Therapy  Reported Symptoms: sadness, worry  Mental Status Exam: Appearance:  Casual     Behavior: Appropriate  Motor: Normal  Speech/Language:  Normal Rate  Affect: Blunt  Mood: Pleasant  Thought process: normal  Thought content:   WNL  Sensory/Perceptual disturbances:   WNL  Orientation: oriented to person, place, time/date, and situation  Attention: Good  Concentration:  Good  Memory: WNL  Fund of knowledge:  Good  Insight:   Good  Judgment:  Good  Impulse Control: Good   Risk Assessment: Danger to Self:  No Self-injurious Behavior: No Danger to Others: No Duty to Warn:no Physical Aggression / Violence:No  Access to Firearms a concern: No  Gang Involvement:No   Subjective: The patient attended an individual therapy session via video visit today.  The patient gave verbal consent for the session to be on Caregility and she is aware of the limitations of telehealth.  Patient was in her home alone and therapist was in the office.  The patient presents as pleasant and cooperative.  The patient reports that she has been doing okay since I saw her last.  She did report that her daughter attempted to  get in touch with her through Facebook messenger because the restraining order is still in effect until the 31st of this month that she wants to have it continued.  We talked about the patient just ignoring the contact as it could cause problems and give a reason as to why she would need the restraining order.  We talked about ignoring the behavior as it seems to be splitting behavior and the patient needs to not react to the situation.  The patient reports that she feels like she is doing well and would like to meet a little more until she gets over this situation with  her daughter.  Otherwise she and her husband are living in the RV and feeling good about how that is going. Interventions: Cognitive Behavioral Therapy and Insight-Oriented  Diagnosis:Adjustment disorder with mixed anxiety and depressed mood  Major depressive disorder, recurrent, mild (HCC)  Plan: Client Abilities/Strengths  Intelligent, ability for insight, good support from husband and church  Client Treatment Preferences  Outpatient individual video visits  Client Statement of Needs  " I need some help managing my depression"  Treatment Level  Outpatient Individual therapy  Symptoms  A family that is not a stable source of positive influence or support, since family members have little or  no contact with each other.: No Description Entered (Status: maintained). Strong emotional response of sadness exhibited when losses are discussed.: No Description Entered (Status: maintained).  Problems Addressed  Grief / Loss Unresolved, Family Conflict  Goals 1. Begin a healthy grieving process around the loss. Objective Verbalize resolution of feelings of guilt and regret associated with the loss. Target Date: 2024-09-25 Frequency: Weekly Progress: 80 Modality: individual Related Interventions 1. Therapy sessions to help patient deal with loss of relationship with grand daughter 2. Reach a level of reduced tension, increased satisfaction, and improved  communication with family and/or other authority figures. Diagnosis F43.23 (Adjustment Disorder, With mixed anxiety and  depressed mood) -  Adjustment Disorder, With Mixed  Anxiety and Depressed Mood  Conditions For Discharge Achievement of treatment goals and objectives  The patient has approved  this treatment plan.    Allyse Fregeau G Adah Stoneberg,  LCSW

## 2023-11-04 ENCOUNTER — Ambulatory Visit (INDEPENDENT_AMBULATORY_CARE_PROVIDER_SITE_OTHER): Admitting: Psychology

## 2023-11-04 DIAGNOSIS — F4323 Adjustment disorder with mixed anxiety and depressed mood: Secondary | ICD-10-CM

## 2023-11-04 NOTE — Progress Notes (Unsigned)
                edge,  experiencing concentration difficulties, having trouble falling or staying asleep, exhibiting a general  state of irritability).: No Description Entered (Status: improved). Motor tension (e.g., restlessness,  tiredness, shakiness, muscle tension).: No Description Entered (Status: improved).  Problems Addressed  Anxiety, Phase Of Life Problems, Anxiety  Goals 1. Learn and implement coping skills that result in a reduction of anxiety  and worry, and improved daily functioning. Objective Learn  and implement calming skills to reduce overall anxiety and manage anxiety symptoms. Target Date: 2025-08-09Frequency: Weekly Progress: 40 Modality: individual  Related Interventions 1. Teach the client calming/relaxation skills (e.g., applied relaxation, progressive muscle  relaxation, cue controlled relaxation; mindful breathing; biofeedback) and how to discriminate  better between relaxation and tension; teach the client how to apply these skills to his/her daily  life (e.g., New Directions in Progressive Muscle Relaxation by Marcelyn Ditty, and  Hazlett-Stevens; Treating Generalized Anxiety Disorder by Rygh and Ida Rogue). Objective Identify, challenge, and replace biased, fearful self-talk with positive, realistic, and empowering selftalk. Target Date: 2024-02-07 Frequency: weekly Progress: 30 Modality: individual Related Interventions 1. Explore the client's schema and self-talk that mediate his/her fear response; assist him/her in  challenging the biases; replace the distorted messages with reality-based alternatives and  positive, realistic self-talk that will increase his/her self-confidence in coping with irrational  fears (see Cognitive Therapy of Anxiety Disorders by Laurence Slate). Objective Learn and implement problem-solving strategies for realistically addressing worries. Target Date: 2025-08-09Frequency: weekly Progress: 40 Modality: individual 2. Resolve conflicted feelings and adapt to the new life circumstances. Objective Apply problem-solving skills to current circumstances. Target Date: 2024-02-07 Frequency: weekly Progress: 20 Modality: individual Related Interventions 1. Teach the client problem-resolution skills (e.g., defining the problem clearly, brainstorming  multiple solutions, listing the pros and cons of each solution, seeking input from others,  selecting and implementing a plan of action, evaluating outcome, and readjusting plan as   necessary).   3. Stabilize anxiety level while increasing ability to function on a daily  basis. Diagnosis F33.1  Major depressive disorder, moderate 300.02 (Generalized anxiety disorder) - Open - [Signifier: n/a]  Axis  none 309.28 (Adjustment disorder with mixed anxiety and depressed  mood) - Open - [Signifier: n/a]  Adjustment Disorder,  With Anxiety   Marital conflict  Major Depressive disorder, moderate  Conditions For Discharge Achievement of treatment goals and objectives.  The patient approved this plan.   Deonna Krummel G Ethridge Sollenberger, LCSW
